# Patient Record
Sex: Female | Born: 1950 | Race: White | Hispanic: No | Marital: Married | Smoking: Former smoker
Health system: Southern US, Community
[De-identification: ages and names within clinical notes are randomized; demographics above are authoritative.]

## PROBLEM LIST (undated history)

## (undated) DIAGNOSIS — S42309A Unspecified fracture of shaft of humerus, unspecified arm, initial encounter for closed fracture: Secondary | ICD-10-CM

## (undated) DIAGNOSIS — F419 Anxiety disorder, unspecified: Secondary | ICD-10-CM

## (undated) DIAGNOSIS — I82402 Acute embolism and thrombosis of unspecified deep veins of left lower extremity: Secondary | ICD-10-CM

## (undated) DIAGNOSIS — M1990 Unspecified osteoarthritis, unspecified site: Secondary | ICD-10-CM

## (undated) HISTORY — DX: Unspecified fracture of shaft of humerus, unspecified arm, initial encounter for closed fracture: S42.309A

## (undated) HISTORY — PX: OTHER SURGICAL HISTORY: SHX169

## (undated) HISTORY — DX: Acute embolism and thrombosis of unspecified deep veins of left lower extremity: I82.402

## (undated) HISTORY — PX: BUNIONECTOMY: SHX129

## (undated) HISTORY — DX: Anxiety disorder, unspecified: F41.9

## (undated) HISTORY — PX: RADIAL HEAD EXCISION: SUR548

## (undated) HISTORY — PX: ANTERIOR CRUCIATE LIGAMENT REPAIR: SHX115

---

## 2010-09-15 ENCOUNTER — Emergency Department (HOSPITAL_COMMUNITY)
Admission: EM | Admit: 2010-09-15 | Discharge: 2010-09-15 | Payer: Self-pay | Source: Home / Self Care | Admitting: Emergency Medicine

## 2010-09-19 LAB — POCT I-STAT, CHEM 8
Creatinine, Ser: 1.1 mg/dL (ref 0.4–1.2)
Hemoglobin: 13.6 g/dL (ref 12.0–15.0)
Potassium: 3.8 mEq/L (ref 3.5–5.1)
Sodium: 141 mEq/L (ref 135–145)
TCO2: 29 mmol/L (ref 0–100)

## 2010-09-19 LAB — URINALYSIS, ROUTINE W REFLEX MICROSCOPIC
Hgb urine dipstick: NEGATIVE
Nitrite: NEGATIVE
Protein, ur: NEGATIVE mg/dL
Specific Gravity, Urine: 1.008 (ref 1.005–1.030)
Urobilinogen, UA: 0.2 mg/dL (ref 0.0–1.0)

## 2010-09-19 LAB — POCT CARDIAC MARKERS: Myoglobin, poc: 55.6 ng/mL (ref 12–200)

## 2010-09-23 ENCOUNTER — Ambulatory Visit
Admission: RE | Admit: 2010-09-23 | Discharge: 2010-09-23 | Payer: Self-pay | Source: Home / Self Care | Attending: Vascular Surgery | Admitting: Vascular Surgery

## 2010-09-23 NOTE — Assessment & Plan Note (Addendum)
OFFICE VISIT  Jennifer Whitehead, Jennifer Whitehead DOB:  01/28/1951                                       09/23/2010 UXNAT#:[id number]  This is a new patient consultation.  This patient is referred from the ER.  REASON FOR REFERRAL:  Possible fibromuscular dysplasia in bilateral internal carotid arteries.  HISTORY OF PRESENT ILLNESS:  This is a 60 year old female who presents with dizziness recently.  This patient recently went to the ER on 09/15/2010.  She was having dizziness in the setting of also some upper respiratory symptomatology.  They obtained a CTA of head and neck.  On the head it demonstrated no stroke.  There were no aneurysms in the intracranial segment.  The extracranial segment had no hemodynamically significant stenosis.  However, there were some irregularities in bilateral internal carotid arteries possibly consistent with fibromuscular dysplasia.  This patient has had no TIA or stroke symptomatology.  Has never had amaurosis fugax, monocular blindness. She has never had any facial droop or hemiplegia.  She has also never had any expressive or receptive aphasia.  She does not have any atherosclerotic risk factors.  PAST MEDICAL HISTORY:  History of left arm fracture, left knee injury and bilateral bunions.  Migraines.  PAST SURGICAL HISTORY:  Included bilateral bunionectomy, left ACL reconstruction and some type of surgery on her left arm for the fracture.  SOCIAL HISTORY:  No tobacco or illicit drug use; social alcohol drinker.  FAMILY HISTORY:  Mother is healthy.  Father died of some type of unknown blood disease related to occupational exposure.  She also has had a cousin with a brain aneurysm.  MEDICATIONS:  She is on multivitamin and Antivert.  ALLERGIES:  Her allergies included sulfa.  REVIEW OF SYSTEMS:  She noted dizziness, headache, blood clots 12 years ago in the right leg, anemia, otherwise is negative.  PHYSICAL EXAMINATION:  Vital  signs:  Her blood pressure in the right arm was 132/82, the left was 137/79, heart rate was 79, respirations were 12, satting 100% on room air. General:  Well-developed, well-nourished, in no apparent distress. Head:  Normocephalic, atraumatic. ENT:  Hearing grossly intact.  Nares without any erythema or drainage. Oropharynx without any erythema or exudate. Neck:  Supple without nuchal rigidity.  No palpable lymphadenopathy. Eyes:  Pupils were equal, round, reactive to light.  Extraocular movements were intact.  I did not detect any nystagmus despite testing for such. Pulmonary:  Symmetric expansion, good air movement.  No rales, rhonchi or wheezing. Cardiac:  Regular rate and rhythm.  Normal S1-S2, no murmurs, rubs, thrills or gallops. Vascular:  She had palpable pulses throughout.  Carotids had no bruits. Her abdominal aorta was palpable with normal size. Abdomen:  Soft, nontender, nondistended.  No guarding, no rebound, no hepatosplenomegaly.  No obvious masses. Musculoskeletal:  She had 5/5 strength in all extremities.  There were no signs of any ischemic changes in any extremity. Neurologic: Cranial nerves 2-12 were intact.  Motor was as above. Sensation was grossly intact in all extremities. Psychiatric:  Judgment was intact.  Mood and affect were appropriate for her clinical situation. Skin:  There were no ulcers or rashes noted. Lymphatics:  No cervical, axillary or inguinal lymphadenopathy.  I reviewed about 5 pages of outside documentation and looked at both her CTA of her head and neck and I am not impressed with the irregularity internal  carotid arteries and I can see why they have some concern of possible fibromuscular dysplasia with the pattern.  However, on my images I do not really appreciate that this is likely fibromuscular dysplasia.  MEDICAL DECISION MAKING:  This is a 60 year old female with a history of dizziness which is more consistent with either  labyrinthitis or benign positional vertigo.  To rule out fibromuscular dysplasia I think it would be simple to do carotid duplexes as none were done in the ER to evaluate the internal carotid segment which should be easily dopplerable and able to detect the pattern consistent of fibromuscular dysplasia. Regardless, at this point she does not have hemodynamically significant stenosis that would require any type of intervention.  Will obtain this over the next 2 to 4 weeks and she will follow up after that.    Fransisco Hertz, MD Electronically Signed  BLC/MEDQ  D:  09/23/2010  T:  09/23/2010  Job:  [id number]

## 2010-10-14 ENCOUNTER — Ambulatory Visit (INDEPENDENT_AMBULATORY_CARE_PROVIDER_SITE_OTHER): Payer: 59 | Admitting: Vascular Surgery

## 2010-10-14 ENCOUNTER — Other Ambulatory Visit (INDEPENDENT_AMBULATORY_CARE_PROVIDER_SITE_OTHER): Payer: 59

## 2010-10-14 DIAGNOSIS — R42 Dizziness and giddiness: Secondary | ICD-10-CM

## 2010-10-14 DIAGNOSIS — I6529 Occlusion and stenosis of unspecified carotid artery: Secondary | ICD-10-CM

## 2010-10-17 NOTE — Assessment & Plan Note (Signed)
OFFICE VISIT  Jennifer, Whitehead DOB:  05-Mar-1951                                       10/14/2010 [id number]#:[id number]  This is an established patient.  HISTORY OF PRESENT ILLNESS:  This is a patient that I saw on 09/23/2010. She was seen in the ER and there were some concerns on CTA of a possible fibromuscular dysplasia in her carotid arteries.  At that point entire workup in the ER was for dizziness.  Currently her symptomatology has completely resolved.  She has not had any TIA or stroke symptoms.  PHYSICAL EXAMINATION:  Today she had a blood pressure 124/76 in the right arm and 124/73 in the left arm, heart rate of 80, respirations were 12, satting 99%.  Cranial nerves II-XII were intact.  Motor strength was 5/5 in all extremities.  Sensation grossly intact in all extremities.  Her bilateral necks do not have carotids though pulses can be auscultated in both sides.  She had bilateral carotid duplexes completed which I interpreted.  She has absolutely no evidence of hemodynamically significant stenosis in either internal carotid artery and no evidence of changes consistent with fibromuscular dysplasia.  MEDICAL DECISION MAKING:  This is a 60 year old female that presented previously with dizziness which has resolved at this point.  I ordered bilateral carotid duplexes to evaluate for possible fibromuscular dysplasia.  At this point the workup is completely negative on duplex. At this point we discussed briefly the medical management of any carotid disease and in general atherosclerotic disease.  She has relatively few risk factors.  She is going to continue to maintain her weight, keep her cholesterol down and possibly add a baby aspirin to her regimen.  At this point no intervention is necessary and no surveillance in my opinion is necessary at this point.  Thank you for giving [place] the opportunity to participate in this patient's care.    Fransisco Hertz, MD Electronically Signed  BLC/MEDQ  D:  10/14/2010  T:  10/14/2010  Job:  [id number]  cc:   Samuel Jester, DO

## 2010-11-14 NOTE — Procedures (Unsigned)
CAROTID DUPLEX EXAM  INDICATION:  Dizziness.  HISTORY: Diabetes:  No. Cardiac:  No. Hypertension:  No. Smoking:  No. Previous Surgery:  No. CV History:  The patient is currently asymptomatic. Amaurosis Fugax No, Paresthesias No, Hemiparesis No                                      RIGHT             LEFT Brachial systolic pressure:         100               90 Brachial Doppler waveforms:         WNL               WNL Vertebral direction of flow:        Antegrade         Antegrade DUPLEX VELOCITIES (cm/sec) CCA peak systolic                   86                87 ECA peak systolic                   55                51 ICA peak systolic                   86                79 ICA end diastolic                   47                37 PLAQUE MORPHOLOGY:                  Mixed soft plaque PLAQUE AMOUNT:                      Mild PLAQUE LOCATION:                    ICA and ECA  IMPRESSION:  Bilaterally no hemodynamically significant stenosis. Bilateral intimal thickening within the common carotid arteries.  ___________________________________________ Fransisco Hertz, MD  OD/MEDQ  D:  10/14/2010  T:  10/14/2010  Job:  [id number]

## 2016-05-09 DIAGNOSIS — Z9189 Other specified personal risk factors, not elsewhere classified: Secondary | ICD-10-CM | POA: Insufficient documentation

## 2017-06-28 DIAGNOSIS — Z01419 Encounter for gynecological examination (general) (routine) without abnormal findings: Secondary | ICD-10-CM | POA: Diagnosis not present

## 2017-06-28 DIAGNOSIS — Z131 Encounter for screening for diabetes mellitus: Secondary | ICD-10-CM | POA: Diagnosis not present

## 2017-06-28 DIAGNOSIS — Z13220 Encounter for screening for lipoid disorders: Secondary | ICD-10-CM | POA: Diagnosis not present

## 2017-06-28 DIAGNOSIS — Z1321 Encounter for screening for nutritional disorder: Secondary | ICD-10-CM | POA: Diagnosis not present

## 2017-11-05 DIAGNOSIS — R197 Diarrhea, unspecified: Secondary | ICD-10-CM | POA: Diagnosis not present

## 2017-11-08 ENCOUNTER — Encounter (INDEPENDENT_AMBULATORY_CARE_PROVIDER_SITE_OTHER): Payer: Self-pay | Admitting: Orthopedic Surgery

## 2017-11-08 ENCOUNTER — Ambulatory Visit (INDEPENDENT_AMBULATORY_CARE_PROVIDER_SITE_OTHER): Payer: BLUE CROSS/BLUE SHIELD

## 2017-11-08 ENCOUNTER — Ambulatory Visit (INDEPENDENT_AMBULATORY_CARE_PROVIDER_SITE_OTHER): Payer: BLUE CROSS/BLUE SHIELD | Admitting: Orthopedic Surgery

## 2017-11-08 DIAGNOSIS — G8929 Other chronic pain: Secondary | ICD-10-CM | POA: Diagnosis not present

## 2017-11-08 DIAGNOSIS — M25562 Pain in left knee: Secondary | ICD-10-CM | POA: Diagnosis not present

## 2017-11-08 DIAGNOSIS — M25561 Pain in right knee: Secondary | ICD-10-CM

## 2017-11-08 NOTE — Progress Notes (Signed)
Office Visit Note   Patient: Jennifer Whitehead           Date of Birth: October 09, 1950           MRN: [id number] Visit Date: 11/08/2017 Requested by: Samuel Jester, DO [address] [place], [place] [address] PCP: Samuel Jester, DO  Subjective: Chief Complaint  Patient presents with  . Right Knee - Pain  . Left Knee - Pain    HPI: Heide is a patient with bilateral knee pain left worse than right.  She had previous ACL reconstruction done 12 years ago.  Reports a little bit of clicking on the left knee anterior lateral.  Describes medial sided burning in both knees.  She does like to work out a lot.  She does do agility training with her dog.  She is working out and doing squats but no lunges.  She works out about 3 times a week.  She is tried Biofreeze for the knee but it has not helped.  She is having some difficulty with stairs and inclines.              ROS: All systems reviewed are negative as they relate to the chief complaint within the history of present illness.  Patient denies  fevers or chills.   Assessment & Plan: Visit Diagnoses:  1. Chronic pain of both knees   2. Chronic pain of left knee     Plan: Impression is bilateral knee pain with mild medial compartment arthritis bilaterally.  Left knee has possibility of lateral meniscal pathology based on her history.  No real effusion in the graft has about 2 mm more laxity on the left compared to the right but with good endpoint.  Negative pivot shift.  Collaterals are stable on the left.  Plan is MRI scan left knee to evaluate for lateral meniscal tear.  I am also going to get her a hinged knee brace and have her try pen said.  I will see her back after that MRI scan and we will decide if we give her a prescription for the pen said or not if it is helping.  Follow-Up Instructions: Return for after MRI.   Orders:  Orders Placed This Encounter  Procedures  . XR KNEE 3 VIEW RIGHT  . XR KNEE 3 VIEW LEFT  . MR Knee  Left w/o contrast   No orders of the defined types were placed in this encounter.     Procedures: No procedures performed   Clinical Data: No additional findings.  Objective: Vital Signs: There were no vitals taken for this visit.  Physical Exam:   Constitutional: Patient appears well-developed HEENT:  Head: Normocephalic Eyes:EOM are normal Neck: Normal range of motion Cardiovascular: Normal rate Pulmonary/chest: Effort normal Neurologic: Patient is alert Skin: Skin is warm Psychiatric: Patient has normal mood and affect    Ortho Exam: Orthopedic exam demonstrates full active and passive range of motion of both knees.  There is no effusion in either knee.  Mild medial joint line tenderness bilaterally.  Pedal pulses palpable.  No masses lymph adenopathy or skin changes noted in the left or right knee region.  Extensor mechanism is intact and nontender.  On that left knee she has about 2 mm more anterior drawer on the left compared to the right but with solid endpoint.  Negative pivot shift on the left-hand side.  Does have lateral joint line tenderness on the left but not the right-hand side.  Specialty Comments:  No specialty comments available.  Imaging: Xr Knee 3 View Left  Result Date: 11/08/2017 AP lateral merchant left knee reviewed.  ACL screw in position in the tibia.  Mild medial spurring is present.  Joint spaces are maintained.  Mild patellofemoral arthritis is noted.  There is no fracture dislocation or effusion.  Xr Knee 3 View Right  Result Date: 11/08/2017 AP lateral merchant right knee reviewed.  Medial spurring is present but there is maintenance of the medial and lateral joint space.  There is mild to moderate patellofemoral arthritis present.  Slight varus alignment present.    PMFS History: There are no active problems to display for this patient.  History reviewed. No pertinent past medical history.  History reviewed. No pertinent family  history.  History reviewed. No pertinent surgical history. Social History   Occupational History  . Not on file  Tobacco Use  . Smoking status: Not on file  Substance and Sexual Activity  . Alcohol use: Not on file  . Drug use: Not on file  . Sexual activity: Not on file

## 2017-11-13 DIAGNOSIS — H00021 Hordeolum internum right upper eyelid: Secondary | ICD-10-CM | POA: Diagnosis not present

## 2017-11-14 ENCOUNTER — Telehealth (INDEPENDENT_AMBULATORY_CARE_PROVIDER_SITE_OTHER): Payer: Self-pay | Admitting: Orthopedic Surgery

## 2017-11-14 DIAGNOSIS — H00021 Hordeolum internum right upper eyelid: Secondary | ICD-10-CM | POA: Diagnosis not present

## 2017-11-14 MED ORDER — DICLOFENAC SODIUM 2 % TD SOLN: 2.0000 | Freq: Two times a day (BID) | TRANSDERMAL | 1 refills | Status: DC | PRN

## 2017-11-14 NOTE — Addendum Note (Signed)
Addended byPrescott Parma: Beverly Suriano on: 11/14/2017 03:15 PM   Modules accepted: Orders

## 2017-11-14 NOTE — Telephone Encounter (Signed)
Patient called stating that at her appointment last week she was given a sample of pennsaid and she really liked how it helped with the pain and would like for a RX to be called in to her pharmacy at [facility] on [address]. CB # [phone]

## 2017-11-14 NOTE — Telephone Encounter (Signed)
I submitted. Patient aware that insurance may not cover.

## 2017-11-19 MED ORDER — DICLOFENAC SODIUM 2 % TD SOLN: 2.0000 | Freq: Two times a day (BID) | TRANSDERMAL | 1 refills | Status: DC | PRN

## 2017-11-19 NOTE — Addendum Note (Signed)
Addended byPrescott Parma: Jennifer Whitehead on: 11/19/2017 01:41 PM   Modules accepted: Orders

## 2017-11-19 NOTE — Telephone Encounter (Signed)
I submitted to [facility] in [place]. IC patient and advised.

## 2017-11-19 NOTE — Telephone Encounter (Signed)
Patient called stating that without insurance the pennsaid was going to be over $3,000. She said you had mentioned going through a 3rd party to get the medication and she would like to discuss this with you. CB # [phone]

## 2017-11-24 ENCOUNTER — Ambulatory Visit
Admission: RE | Admit: 2017-11-24 | Discharge: 2017-11-24 | Disposition: A | Discharge status: Home / Self Care | Payer: BLUE CROSS/BLUE SHIELD | Source: Ambulatory Visit | Attending: Orthopedic Surgery | Admitting: Orthopedic Surgery

## 2017-11-24 DIAGNOSIS — M25562 Pain in left knee: Secondary | ICD-10-CM | POA: Diagnosis not present

## 2017-11-24 DIAGNOSIS — G8929 Other chronic pain: Secondary | ICD-10-CM

## 2017-12-06 ENCOUNTER — Encounter (INDEPENDENT_AMBULATORY_CARE_PROVIDER_SITE_OTHER): Payer: Self-pay | Admitting: Orthopedic Surgery

## 2017-12-06 ENCOUNTER — Telehealth (INDEPENDENT_AMBULATORY_CARE_PROVIDER_SITE_OTHER): Payer: Self-pay | Admitting: Orthopedic Surgery

## 2017-12-06 ENCOUNTER — Ambulatory Visit (INDEPENDENT_AMBULATORY_CARE_PROVIDER_SITE_OTHER): Payer: BLUE CROSS/BLUE SHIELD | Admitting: Orthopedic Surgery

## 2017-12-06 DIAGNOSIS — M1712 Unilateral primary osteoarthritis, left knee: Secondary | ICD-10-CM

## 2017-12-06 MED ORDER — HYDROCODONE-ACETAMINOPHEN 5-325 MG PO TABS: ORAL_TABLET | ORAL | 0 refills | Status: DC

## 2017-12-06 NOTE — Telephone Encounter (Signed)
Jennifer Whitehead with [facility] called advised she is faxing a form over concerning the Rx for Pensaid. The number to contact Toney ReilDaisy is [phone]

## 2017-12-07 NOTE — Telephone Encounter (Signed)
Received, completed and faxed back.

## 2017-12-09 ENCOUNTER — Encounter (INDEPENDENT_AMBULATORY_CARE_PROVIDER_SITE_OTHER): Payer: Self-pay | Admitting: Orthopedic Surgery

## 2017-12-09 NOTE — Progress Notes (Signed)
   Office Visit Note   Patient: Jennifer Whitehead           Date of Birth: 1951-06-27           MRN: [id number] Visit Date: 12/06/2017 Requested by: No referring provider defined for this encounter. PCP: System, Pcp Not In  Subjective: Chief Complaint  Patient presents with  . Left Knee - Follow-up    HPI: Jennifer Whitehead is a patient with left knee pain.  Since I have seen her she has had a left knee MRI scan.  This scan shows tricompartmental osteoarthritis along with severely attenuated torn ACL graft.  Is been adjusting her workouts so that she does not do lunges or stairmaster.  She states that the pain comes and goes.  She is been using a brace when she is running with her dogs.  She does take occasional hydrocodone for her symptoms.  Does not really report much in the way of symptomatic instability but does have pain.              ROS: All systems reviewed are negative as they relate to the chief complaint within the history of present illness.  Patient denies  fevers or chills.   Assessment & Plan: Visit Diagnoses:  1. Unilateral primary osteoarthritis, left knee     Plan: Impression is tricompartmental arthritis with absent posterior horn of the medial meniscus and dysfunctional ACL.  Plan is continue strengthening exercises.  She is going to need some type of procedure in the future but currently she is asymptomatic enough that that is not really at play.  I will see her back as needed.  Follow-Up Instructions: Return if symptoms worsen or fail to improve.   Orders:  No orders of the defined types were placed in this encounter.  Meds ordered this encounter  Medications  . HYDROcodone-acetaminophen (NORCO/VICODIN) 5-325 MG tablet    Sig: 1 po q d prn pain    Dispense:  30 tablet    Refill:  0      Procedures: No procedures performed   Clinical Data: No additional findings.  Objective: Vital Signs: There were no vitals taken for this visit.  Physical Exam:    Constitutional: Patient appears well-developed HEENT:  Head: Normocephalic Eyes:EOM are normal Neck: Normal range of motion Cardiovascular: Normal rate Pulmonary/chest: Effort normal Neurologic: Patient is alert Skin: Skin is warm Psychiatric: Patient has normal mood and affect    Ortho Exam: Orthopedic exam demonstrates 5 degree flexion contracture on the left with good flexion.  Collaterals are stable.  No PCL instability.  Not much in the way of anterior instability.  Collaterals are stable.  No effusion.  Range of motion is good.  No groin pain with internal/external rotation of the leg.Marland Kitchen.  Specialty Comments:  No specialty comments available.  Imaging: No results found.   PMFS History: There are no active problems to display for this patient.  History reviewed. No pertinent past medical history.  History reviewed. No pertinent family history.  History reviewed. No pertinent surgical history. Social History   Occupational History  . Not on file  Tobacco Use  . Smoking status: Never Smoker  . Smokeless tobacco: Never Used  Substance and Sexual Activity  . Alcohol use: Not on file  . Drug use: Not on file  . Sexual activity: Not on file

## 2017-12-12 DIAGNOSIS — R197 Diarrhea, unspecified: Secondary | ICD-10-CM | POA: Diagnosis not present

## 2018-03-26 ENCOUNTER — Telehealth (INDEPENDENT_AMBULATORY_CARE_PROVIDER_SITE_OTHER): Payer: Self-pay | Admitting: Orthopedic Surgery

## 2018-03-26 NOTE — Telephone Encounter (Signed)
IC explained, no appt available. Scheduled her to see Lillia AbedLindsay on Friday.

## 2018-03-26 NOTE — Telephone Encounter (Signed)
Patient called asked if she can be worked into Dr [facility] HoldingsDe[facility] schedule because it hurts her to drive. Patient said her right knee has gotten worse and she need a cortisone injection. The number to contact patient is [phone]

## 2018-03-29 ENCOUNTER — Encounter (INDEPENDENT_AMBULATORY_CARE_PROVIDER_SITE_OTHER): Payer: Self-pay | Admitting: Physician Assistant

## 2018-03-29 ENCOUNTER — Ambulatory Visit (INDEPENDENT_AMBULATORY_CARE_PROVIDER_SITE_OTHER): Payer: BLUE CROSS/BLUE SHIELD | Admitting: Physician Assistant

## 2018-03-29 DIAGNOSIS — G8929 Other chronic pain: Secondary | ICD-10-CM | POA: Diagnosis not present

## 2018-03-29 DIAGNOSIS — M25561 Pain in right knee: Secondary | ICD-10-CM | POA: Diagnosis not present

## 2018-03-29 MED ORDER — METHYLPREDNISOLONE ACETATE 40 MG/ML IJ SUSP
40.0000 mg | INTRAMUSCULAR | Status: AC | PRN
  Administered 2018-03-29: 40 mg via INTRA_ARTICULAR

## 2018-03-29 MED ORDER — BUPIVACAINE HCL 0.25 % IJ SOLN
2.0000 mL | INTRAMUSCULAR | Status: AC | PRN
  Administered 2018-03-29: 2 mL via INTRA_ARTICULAR

## 2018-03-29 MED ORDER — LIDOCAINE HCL 1 % IJ SOLN
2.0000 mL | INTRAMUSCULAR | Status: AC | PRN
  Administered 2018-03-29: 2 mL

## 2018-03-29 NOTE — Progress Notes (Signed)
   Procedure Note  Patient: Jennifer Whitehead             Date of Birth: 1951/08/14           MRN: [id number]             Visit Date: 03/29/2018  Procedures: Visit Diagnoses: Chronic pain of right knee  Large Joint Inj: R knee on 03/29/2018 1:20 PM Indications: pain Details: 22 G needle, anterolateral approach Medications: 2 mL lidocaine 1 %; 2 mL bupivacaine 0.25 %; 40 mg methylPREDNISolone acetate 40 MG/ML

## 2018-03-29 NOTE — Progress Notes (Signed)
     Patient: Jennifer BubaMildred Whitehead           Date of Birth: Dec 10, 1950           MRN: [id number] Visit Date: 03/29/2018 PCP: System, Pcp Not In   Assessment & Plan:  Chief Complaint:  Chief Complaint  Patient presents with  . Right Knee - Pain   Visit Diagnoses:  1. Chronic pain of right knee     Plan: Patient is a pleasant 67 year old female who presents to our clinic today with right knee pain.  History of tract departmental osteoarthritis on the left knee and she has been favoring the right knee where she also has known arthritis.  No previous cortisone injection, but she is requesting one today.  Today, we proceeded with a right knee intra-articular cortisone injection.  She is encouraged to rest, ice and elevate as much as possible over the next couple days.  She will follow-up with Dr. August Saucerean as needed.  Call with concerns or questions in the meantime.  Follow-Up Instructions: Return if symptoms worsen or fail to improve.   Orders:  No orders of the defined types were placed in this encounter.  No orders of the defined types were placed in this encounter.   Imaging: No results found.  PMFS History: There are no active problems to display for this patient.  History reviewed. No pertinent past medical history.  History reviewed. No pertinent family history.  History reviewed. No pertinent surgical history. Social History   Occupational History  . Not on file  Tobacco Use  . Smoking status: Never Smoker  . Smokeless tobacco: Never Used  Substance and Sexual Activity  . Alcohol use: Not on file  . Drug use: Not on file  . Sexual activity: Not on file

## 2018-04-22 ENCOUNTER — Ambulatory Visit (INDEPENDENT_AMBULATORY_CARE_PROVIDER_SITE_OTHER): Payer: BLUE CROSS/BLUE SHIELD | Admitting: Orthopedic Surgery

## 2018-04-22 ENCOUNTER — Encounter (INDEPENDENT_AMBULATORY_CARE_PROVIDER_SITE_OTHER): Payer: Self-pay | Admitting: Orthopedic Surgery

## 2018-04-22 ENCOUNTER — Ambulatory Visit (INDEPENDENT_AMBULATORY_CARE_PROVIDER_SITE_OTHER): Payer: Self-pay

## 2018-04-22 DIAGNOSIS — M25572 Pain in left ankle and joints of left foot: Secondary | ICD-10-CM

## 2018-04-23 NOTE — Progress Notes (Deleted)
Office Visit Note   Patient: Jennifer Whitehead           Date of Birth: [birth year]           MRN: [id number] Visit Date: 04/22/2018 Requested by: No referring provider defined for this encounter. PCP: System, Pcp Not In  Subjective: Chief Complaint  Patient presents with  . Left Foot - Pain, Injury  . Left Knee - Follow-up    HPI: Jennifer Whitehead is a patient with right shoulder pain.  Patient had an injection 03/26/2018.  Reports chronic right shoulder pain.  She had arthroscopy with distal clavicle excision in August 2018.  She is here to discuss surgical options.  She has rheumatoid arthritis and osteoarthritis.  She takes Plaquenil and methotrexate.  She states that the shoulder catches.  She has pain which radiates down the arm to the fingers.  She states she has some "problems in her neck".  She cannot really do much of anything with that right shoulder.  She has a trip to the beach occurring in November.  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              ROS: All systems reviewed are negative as they relate to the chief complaint within the history of present illness.  Patient denies  fevers or chills.   Assessment & Plan: Visit Diagnoses:  1. Pain in left ankle and joints of left foot     Plan: Impression is right shoulder rotator cuff arthropathy with diminished shoulder function the patient who has rheumatoid arthritis.  She is at increased surgical risk for infections as well as implant loosening due to bone quality.  Discussed with her the risk and benefits of reverse shoulder replacement.  They include but not limited to infection nerve vessel damage dislocation implant loosening.  I will see her back in mid October so we can discuss options further.  I would want to get CT scan for patient specific instrumentation and planning.  Follow-Up Instructions: No follow-ups on file.   Orders:  Orders Placed This Encounter    Procedures  . XR Foot Complete Left   No orders of the defined types were placed in this encounter.     Procedures: No procedures performed   Clinical Data: No additional findings.  Objective: Vital Signs: There were no vitals taken for this visit.  Physical Exam:   Constitutional: Patient appears well-developed HEENT:  Head: Normocephalic Eyes:EOM are normal Neck: Normal range of motion Cardiovascular: Normal rate Pulmonary/chest: Effort normal Neurologic: Patient is alert Skin: Skin is warm Psychiatric: Patient has normal mood and affect    Ortho Exam: Ortho exam demonstrates pretty reasonable cervical spine range of motion with 5 out of 5 grip EPL FPL interosseous wrist flexion extension bicep triceps and deltoid strength.  Patient has less than 90 degrees of forward flexion and abduction on the right-hand side.  She has diminished infraspinatus and supraspinatus strength testing.  Subscap strength seems intact.  Passive range of motion is pretty easy to 170/90 degrees of abduction.  Specialty Comments:  No specialty comments available.  Imaging: No results found.   PMFS History: There are no active problems to display for this patient.  History reviewed. No pertinent past medical history.  History reviewed. No pertinent family history.  History reviewed. No pertinent surgical history. Social History   Occupational History  . Not on file  Tobacco Use  . Smoking status: Never Smoker  .  Smokeless tobacco: Never Used  Substance and Sexual Activity  . Alcohol use: Not on file  . Drug use: Not on file  . Sexual activity: Not on file

## 2018-04-23 NOTE — Progress Notes (Signed)
Office Visit Note   Patient: Jennifer Whitehead           Date of Birth: Jun 18, 1951           MRN: [id number] Visit Date: 04/22/2018 Requested by: No referring provider defined for this encounter. PCP: System, Pcp Not In  Subjective: Chief Complaint  Patient presents with  . Left Foot - Pain, Injury  . Left Knee - Follow-up    HPI: Jennifer CroftMildred is a patient with left foot pain.  She rolled her foot a few days ago and reports pain on the lateral aspect around the base the fifth metatarsal.  She is also here for follow-up of her left knee.  She reports continued pain weakness and giving way.  Though giving way is the more symptomatic issue.  The right knee had cortisone 3 weeks ago but that is the knee that is giving her pain.  She likes to work out about 3 times a week.              ROS: All systems reviewed are negative as they relate to the chief complaint within the history of present illness.  Patient denies  fevers or chills.   Assessment & Plan: Visit Diagnoses:  1. Pain in left ankle and joints of left foot     Plan: Impression is left knee arthritis with symptomatic giving way and diminutive ACL graft from years ago.  The right knee is the more painful knee which also has arthritis present.  She is going to consider her options for total knee replacement on the right-hand side and call me when she wants to schedule.  We discussed risk and benefits of that procedure today including but not limited to infection nerve vessel damage knee stiffness and potential need for revision.  Patient understands the risk benefits and wishes to proceed at some time in the future.  Follow-Up Instructions: No follow-ups on file.   Orders:  Orders Placed This Encounter  Procedures  . XR Foot Complete Left   No orders of the defined types were placed in this encounter.     Procedures: No procedures performed   Clinical Data: No additional findings.  Objective: Vital Signs: There were no  vitals taken for this visit.  Physical Exam:   Constitutional: Patient appears well-developed HEENT:  Head: Normocephalic Eyes:EOM are normal Neck: Normal range of motion Cardiovascular: Normal rate Pulmonary/chest: Effort normal Neurologic: Patient is alert Skin: Skin is warm Psychiatric: Patient has normal mood and affect    Ortho Exam: Ortho exam demonstrates on that left ankle palpable intact nontender anterior to posterior tib peroneal and Achilles tendons with symmetric pedal pulses.  She does have some tenderness at the base of the fifth metatarsal.  Radiographs negative in this area.  No other masses lymph adenopathy or skin changes noted in that left knee region.  Both knees are examined.  Some laxity is difficult to assess left knee versus right knee.  In general the left knee although is diminutive ACL graft feels relatively stable.  Slight flexion contracture is present.  On that right knee she has medial and lateral joint line tenderness intact extensor mechanism and palpable pedal pulses.  Specialty Comments:  No specialty comments available.  Imaging: No results found.   PMFS History: There are no active problems to display for this patient.  History reviewed. No pertinent past medical history.  History reviewed. No pertinent family history.  History reviewed. No pertinent surgical history. Social History  Occupational History  . Not on file  Tobacco Use  . Smoking status: Never Smoker  . Smokeless tobacco: Never Used  Substance and Sexual Activity  . Alcohol use: Not on file  . Drug use: Not on file  . Sexual activity: Not on file

## 2018-06-05 DIAGNOSIS — Z23 Encounter for immunization: Secondary | ICD-10-CM | POA: Diagnosis not present

## 2018-06-20 ENCOUNTER — Telehealth (INDEPENDENT_AMBULATORY_CARE_PROVIDER_SITE_OTHER): Payer: Self-pay | Admitting: Orthopedic Surgery

## 2018-06-20 NOTE — Telephone Encounter (Signed)
Please review and complete blue sheet so I can give to [facility]

## 2018-06-20 NOTE — Telephone Encounter (Signed)
Patient called stated wanted to move forward with knee replacement.  Please call patient to advise. [phone]

## 2018-06-21 NOTE — Telephone Encounter (Signed)
Done pls calal thx

## 2018-06-21 NOTE — Telephone Encounter (Signed)
Given to Debbie

## 2018-07-02 DIAGNOSIS — Z1239 Encounter for other screening for malignant neoplasm of breast: Secondary | ICD-10-CM | POA: Diagnosis not present

## 2018-07-02 DIAGNOSIS — Z131 Encounter for screening for diabetes mellitus: Secondary | ICD-10-CM | POA: Diagnosis not present

## 2018-07-02 DIAGNOSIS — Z1231 Encounter for screening mammogram for malignant neoplasm of breast: Secondary | ICD-10-CM | POA: Diagnosis not present

## 2018-07-02 DIAGNOSIS — Z01419 Encounter for gynecological examination (general) (routine) without abnormal findings: Secondary | ICD-10-CM | POA: Diagnosis not present

## 2018-07-02 DIAGNOSIS — Z1321 Encounter for screening for nutritional disorder: Secondary | ICD-10-CM | POA: Diagnosis not present

## 2018-07-02 DIAGNOSIS — Z13220 Encounter for screening for lipoid disorders: Secondary | ICD-10-CM | POA: Diagnosis not present

## 2018-07-15 ENCOUNTER — Ambulatory Visit (INDEPENDENT_AMBULATORY_CARE_PROVIDER_SITE_OTHER): Payer: BLUE CROSS/BLUE SHIELD | Admitting: Orthopedic Surgery

## 2018-07-15 ENCOUNTER — Encounter (INDEPENDENT_AMBULATORY_CARE_PROVIDER_SITE_OTHER): Payer: Self-pay | Admitting: Orthopedic Surgery

## 2018-07-15 DIAGNOSIS — M1712 Unilateral primary osteoarthritis, left knee: Secondary | ICD-10-CM

## 2018-07-15 DIAGNOSIS — M1711 Unilateral primary osteoarthritis, right knee: Secondary | ICD-10-CM | POA: Diagnosis not present

## 2018-07-17 ENCOUNTER — Encounter (INDEPENDENT_AMBULATORY_CARE_PROVIDER_SITE_OTHER): Payer: Self-pay | Admitting: Orthopedic Surgery

## 2018-07-17 DIAGNOSIS — M1712 Unilateral primary osteoarthritis, left knee: Secondary | ICD-10-CM

## 2018-07-17 DIAGNOSIS — M1711 Unilateral primary osteoarthritis, right knee: Secondary | ICD-10-CM

## 2018-07-17 MED ORDER — METHYLPREDNISOLONE ACETATE 40 MG/ML IJ SUSP
40.0000 mg | INTRAMUSCULAR | Status: AC | PRN
  Administered 2018-07-17: 40 mg via INTRA_ARTICULAR

## 2018-07-17 MED ORDER — LIDOCAINE HCL 1 % IJ SOLN
5.0000 mL | INTRAMUSCULAR | Status: AC | PRN
  Administered 2018-07-17: 5 mL

## 2018-07-17 MED ORDER — BUPIVACAINE HCL 0.25 % IJ SOLN
4.0000 mL | INTRAMUSCULAR | Status: AC | PRN
  Administered 2018-07-17: 4 mL via INTRA_ARTICULAR

## 2018-07-17 NOTE — Progress Notes (Signed)
Office Visit Note   Patient: Jennifer Whitehead           Date of Birth: 21-Oct-1950           MRN: [id number] Visit Date: 07/15/2018 Requested by: No referring provider defined for this encounter. PCP: System, Pcp Not In  Subjective: Chief Complaint  Patient presents with  . Left Knee - Pain  . Right Knee - Pain    HPI: Jennifer CroftMildred is a patient with bilateral knee pain.  She reports previous injection in the right knee in August which helped.  She has known history of left knee tricompartmental arthritis as well.  She has ACL laxity in that left knee.  She states that the left knee will go out of place.  Over-the-counter medications have not been helpful but she does use Mobic.  She try to get off Mobic but was hurting a lot after 1 week.  This is affecting her quality of life.  Difficult for her to go down the stairs due to the right knee pain and left knee pain and instability.  She works with her dog in Product/process development scientistagility training.  She likes to work out about 3 times a week.  Right knee injection helped for 3 months but she is had no injection in the left knee.              ROS: All systems reviewed are negative as they relate to the chief complaint within the history of present illness.  Patient denies  fevers or chills.   Assessment & Plan: Visit Diagnoses:  1. Unilateral primary osteoarthritis, left knee   2. Unilateral primary osteoarthritis, right knee     Plan: Impression is good result with right knee injection in August.  Patient is having left knee symptoms.  Plan is to inject the right knee today.  We will also inject the left knee.  We will see how that works for her in terms of relieving some pain.  Consider surgical treatment next year.  42100-month return in February to discuss knee replacement.  Follow-Up Instructions: Return in about 4 months (around 11/13/2018).   Orders:  No orders of the defined types were placed in this encounter.  No orders of the defined types were placed in  this encounter.     Procedures: Large Joint Inj: bilateral knee on 07/17/2018 12:14 PM Indications: diagnostic evaluation, joint swelling and pain Details: 18 G 1.5 in needle, superolateral approach  Arthrogram: No  Medications (Right): 5 mL lidocaine 1 %; 4 mL bupivacaine 0.25 %; 40 mg methylPREDNISolone acetate 40 MG/ML Medications (Left): 5 mL lidocaine 1 %; 4 mL bupivacaine 0.25 %; 40 mg methylPREDNISolone acetate 40 MG/ML Outcome: tolerated well, no immediate complications Procedure, treatment alternatives, risks and benefits explained, specific risks discussed. Consent was given by the patient. Immediately prior to procedure a time out was called to verify the correct patient, procedure, equipment, support staff and site/side marked as required. Patient was prepped and draped in the usual sterile fashion.       Clinical Data: No additional findings.  Objective: Vital Signs: There were no vitals taken for this visit.  Physical Exam:   Constitutional: Patient appears well-developed HEENT:  Head: Normocephalic Eyes:EOM are normal Neck: Normal range of motion Cardiovascular: Normal rate Pulmonary/chest: Effort normal Neurologic: Patient is alert Skin: Skin is warm Psychiatric: Patient has normal mood and affect    Ortho Exam: Ortho exam demonstrates normal gait alignment with palpable pedal pulses.  Knee extensor mechanism  is intact.  Collaterals are stable bilaterally.  She does have some ACL laxity on the left but not on the right.  Range of motion is easily past 90 degrees in both knees.  Specialty Comments:  No specialty comments available.  Imaging: No results found.   PMFS History: There are no active problems to display for this patient.  History reviewed. No pertinent past medical history.  History reviewed. No pertinent family history.  History reviewed. No pertinent surgical history. Social History   Occupational History  . Not on file  Tobacco  Use  . Smoking status: Never Smoker  . Smokeless tobacco: Never Used  Substance and Sexual Activity  . Alcohol use: Not on file  . Drug use: Not on file  . Sexual activity: Not on file

## 2018-09-09 ENCOUNTER — Other Ambulatory Visit (INDEPENDENT_AMBULATORY_CARE_PROVIDER_SITE_OTHER): Payer: Self-pay | Admitting: Orthopedic Surgery

## 2018-09-09 DIAGNOSIS — M1712 Unilateral primary osteoarthritis, left knee: Secondary | ICD-10-CM

## 2018-09-13 NOTE — Pre-Procedure Instructions (Addendum)
Jennifer Whitehead  09/13/2018     [id number] [place] - [address] [place] [id number] Phone: [phone] Fax: [phone]  [facility] [place] - [address] [place] [id number] Phone: [phone] Fax: [phone]    Your procedure is scheduled on  Tuesday, September 24, 2017  [facility] at [facility]:30 [facility].M.  Call this number if you have problems the morning of surgery:  [phone]   Remember:  Do not eat or drink after midnight.   Take these medicines the morning of surgery with A SIP OF WATER   [name]   Follow your surgeon's instructions on when to stop Aspirin.  If no instructions were given by your surgeon then you will need to call the office to get those instructions.    7 days prior to surgery STOP taking any Aspirin (unless otherwise instructed by your surgeon), Aleve, Naproxen, Ibuprofen, Motrin, Advil, Goody's, BC's, all herbal medications, fish oil, and all vitamins, including meloxicam (MOBIC).   Do not wear jewelry, make-up or nail polish.  Do not wear lotions, powders, or perfumes, or deodorant.  Do not shave 48 hours prior to surgery.   Do not bring valuables to the hospital.  [facility] is not responsible for any belongings or valuables.  Contacts, dentures or bridgework may not be worn into surgery.  Leave your suitcase in the car.  After surgery it may be brought to your room.  For patients admitted to the hospital, discharge time will be determined by your treatment team.  Patients discharged the day of surgery will not be allowed to drive home.   Special instructions:   [facility]- Preparing For Surgery  Before surgery, you can play an important role. Because skin is not sterile, your skin needs to be as free of germs as possible. You can reduce the number of germs on your skin by washing with CHG (chlorahexidine gluconate)  Soap before surgery.  CHG is an antiseptic cleaner which kills germs and bonds with the skin to continue killing germs even after washing.    Oral Hygiene is also important to reduce your risk of infection.  Remember - BRUSH YOUR TEETH THE MORNING OF SURGERY WITH YOUR REGULAR TOOTHPASTE  Please do not use if you have an allergy to CHG or antibacterial soaps. If your skin becomes reddened/irritated stop using the CHG.  Do not shave (including legs and underarms) for at least 48 hours prior to first CHG shower. It is OK to shave your face.  Please follow these instructions carefully.   1. Shower the NIGHT BEFORE SURGERY and the MORNING OF SURGERY with CHG.   2. If you chose to wash your hair, wash your hair first as usual with your normal shampoo.  3. After you shampoo, rinse your hair and body thoroughly to remove the shampoo.  4. Use CHG as you would any other liquid soap. You can apply CHG directly to the skin and wash gently with a scrungie or a clean washcloth.   5. Apply the CHG Soap to your body ONLY FROM THE NECK DOWN.  Do not use on open wounds or open sores. Avoid contact with your eyes, ears, mouth and genitals (private parts). Wash Face and genitals (private parts)  with your normal soap.  6. Wash thoroughly, paying special attention to the area where your surgery will be performed.  7. Thoroughly rinse  your body with warm water from the neck down.  8. DO NOT shower/wash with your normal soap after using and rinsing off the CHG Soap.  9. Pat yourself dry with a CLEAN TOWEL.  10. Wear CLEAN PAJAMAS to bed the night before surgery, wear comfortable clothes the morning of surgery  11. Place CLEAN SHEETS on your bed the night of your first shower and DO NOT SLEEP WITH PETS.  Day of Surgery:  Do not apply any deodorants/lotions.  Please wear clean clothes to the hospital/surgery center.   Remember to brush your teeth WITH YOUR REGULAR TOOTHPASTE.  Please read over the  following fact sheets that you were given. Pain Booklet, Coughing and Deep Breathing, MRSA Information and Surgical Site Infection Prevention

## 2018-09-16 ENCOUNTER — Encounter (HOSPITAL_COMMUNITY): Payer: Self-pay | Admitting: *Deleted

## 2018-09-16 ENCOUNTER — Encounter (HOSPITAL_COMMUNITY)
Admission: RE | Admit: 2018-09-16 | Discharge: 2018-09-16 | Disposition: A | Discharge status: Home / Self Care | Payer: BLUE CROSS/BLUE SHIELD | Source: Ambulatory Visit | Attending: Orthopedic Surgery | Admitting: Orthopedic Surgery

## 2018-09-16 DIAGNOSIS — Z01812 Encounter for preprocedural laboratory examination: Secondary | ICD-10-CM | POA: Insufficient documentation

## 2018-09-16 HISTORY — DX: Unspecified osteoarthritis, unspecified site: M19.90

## 2018-09-16 LAB — CBC
HCT: 40.2 % (ref 36.0–46.0)
Hemoglobin: 12.9 g/dL (ref 12.0–15.0)
MCH: 30.4 pg (ref 26.0–34.0)
MCHC: 32.1 g/dL (ref 30.0–36.0)
MCV: 94.8 fL (ref 80.0–100.0)
Platelets: 305 10*3/uL (ref 150–400)
RBC: 4.24 MIL/uL (ref 3.87–5.11)
RDW: 13.8 % (ref 11.5–15.5)
WBC: 10 10*3/uL (ref 4.0–10.5)
nRBC: 0 % (ref 0.0–0.2)

## 2018-09-16 LAB — URINALYSIS, ROUTINE W REFLEX MICROSCOPIC
Bilirubin Urine: NEGATIVE
Glucose, UA: NEGATIVE mg/dL
Hgb urine dipstick: NEGATIVE
Ketones, ur: NEGATIVE mg/dL
Leukocytes, UA: NEGATIVE
Nitrite: NEGATIVE
Protein, ur: NEGATIVE mg/dL
Specific Gravity, Urine: 1.005 (ref 1.005–1.030)
pH: 5 (ref 5.0–8.0)

## 2018-09-16 LAB — BASIC METABOLIC PANEL WITH GFR
Anion gap: 10 (ref 5–15)
BUN: 20 mg/dL (ref 8–23)
CO2: 26 mmol/L (ref 22–32)
Calcium: 9.4 mg/dL (ref 8.9–10.3)
Chloride: 104 mmol/L (ref 98–111)
Creatinine, Ser: 0.71 mg/dL (ref 0.44–1.00)
GFR calc Af Amer: >60 mL/min
GFR calc non Af Amer: >60 mL/min
Glucose, Bld: 122 mg/dL — ABNORMAL HIGH (ref 70–99)
Potassium: 3.8 mmol/L (ref 3.5–5.1)
Sodium: 140 mmol/L (ref 135–145)

## 2018-09-16 LAB — SURGICAL PCR SCREEN
MRSA, PCR: NEGATIVE
Staphylococcus aureus: NEGATIVE

## 2018-09-16 NOTE — Pre-Procedure Instructions (Signed)
Sharnae Friedrich  09/16/2018     [id number] [place], [place] - [address] [place] [address] Phone: [phone] Fax: [phone]  [facility] [place] - [address] [place] [address] Phone: [phone] Fax: [phone]    Your procedure is scheduled on  Tuesday, September 24, 2017  Report to [facility] Admitting at 5:30 A.M.  Call this number if you have problems the morning of surgery:  [phone]   Remember:  Do not eat or drink after midnight.   Take these medicines the morning of surgery with A SIP OF WATER   [name]     7 days prior to surgery STOP taking any Aspirin (unless otherwise instructed by your surgeon), Aleve, Naproxen, Ibuprofen, Motrin, Advil, Goody's, BC's, all herbal medications, fish oil, and all vitamins, including meloxicam (MOBIC).   Do not wear jewelry, make-up or nail polish.  Do not wear lotions, powders, or perfumes, or deodorant.  Do not shave 48 hours prior to surgery.   Do not bring valuables to the hospital.  [facility] is not responsible for any belongings or valuables.  Contacts, dentures or bridgework may not be worn into surgery.  Leave your suitcase in the car.  After surgery it may be brought to your room.  For patients admitted to the hospital, discharge time will be determined by your treatment team.  Patients discharged the day of surgery will not be allowed to drive home.   Special instructions:   [facility]- Preparing For Surgery  Before surgery, you can play an important role. Because skin is not sterile, your skin needs to be as free of germs as possible. You can reduce the number of germs on your skin by washing with CHG (chlorahexidine gluconate) Soap before surgery.  CHG is an antiseptic cleaner which kills germs and bonds with the skin to continue killing germs even after washing.    Oral Hygiene is also  important to reduce your risk of infection.  Remember - BRUSH YOUR TEETH THE MORNING OF SURGERY WITH YOUR REGULAR TOOTHPASTE  Please do not use if you have an allergy to CHG or antibacterial soaps. If your skin becomes reddened/irritated stop using the CHG.  Do not shave (including legs and underarms) for at least 48 hours prior to first CHG shower. It is OK to shave your face.  Please follow these instructions carefully.   1. Shower the NIGHT BEFORE SURGERY and the MORNING OF SURGERY with CHG.   2. If you chose to wash your hair, wash your hair first as usual with your normal shampoo.  3. After you shampoo, rinse your hair and body thoroughly to remove the shampoo.  4. Use CHG as you would any other liquid soap. You can apply CHG directly to the skin and wash gently with a scrungie or a clean washcloth.   5. Apply the CHG Soap to your body ONLY FROM THE NECK DOWN.  Do not use on open wounds or open sores. Avoid contact with your eyes, ears, mouth and genitals (private parts). Wash Face and genitals (private parts)  with your normal soap.  6. Wash thoroughly, paying special attention to the area where your surgery will be performed.  7. Thoroughly rinse your body with warm water from the neck down.  8. DO NOT shower/wash with your normal soap after using and rinsing off the CHG Soap.  9. Pat yourself dry  with a CLEAN TOWEL.  10. Wear CLEAN PAJAMAS to bed the night before surgery, wear comfortable clothes the morning of surgery  11. Place CLEAN SHEETS on your bed the night of your first shower and DO NOT SLEEP WITH PETS.  Day of Surgery:  Do not apply any deodorants/lotions.  Please wear clean clothes to the hospital/surgery center.   Remember to brush your teeth WITH YOUR REGULAR TOOTHPASTE.  Please read over the following fact sheets that you were given. Pain Booklet, Coughing and Deep Breathing, MRSA Information and Surgical Site Infection Prevention

## 2018-09-16 NOTE — Progress Notes (Signed)
PCP -  No PCP,pt. See Dr. Malena Peer @ [facility] in [facility] Cardiologist - na  Chest x-ray - na EKG - na Stress Test - na ECHO - na Cardiac Cath - na  Sleep Study - na CPAP -   Fasting Blood Sugar - na Checks Blood Sugar _____ times a day  Blood Thinner Instructions: Aspirin Instructions:na  Anesthesia review:   Patient denies shortness of breath, fever, cough and chest pain at PAT appointment   Patient verbalized understanding of instructions that were given to them at the PAT appointment. Patient was also instructed that they will need to review over the PAT instructions again at home before surgery.

## 2018-09-17 LAB — URINE CULTURE: Culture: <10000 — AB

## 2018-09-20 ENCOUNTER — Telehealth (INDEPENDENT_AMBULATORY_CARE_PROVIDER_SITE_OTHER): Payer: Self-pay | Admitting: Orthopedic Surgery

## 2018-09-20 NOTE — Telephone Encounter (Signed)
Patient called and left message on voicemail wanting to discuss need for  handicap parking sticker and had question about using a handicap ramp. She's not sure if a ramp will  be necessary, but all entrances into the home have steps.  Please advise.    Pt's cb [phone]

## 2018-09-23 MED ORDER — CEFAZOLIN SODIUM-DEXTROSE 2-4 GM/100ML-% IV SOLN
2.0000 g | INTRAVENOUS | Status: AC
  Administered 2018-09-24: 2 g via INTRAVENOUS
  Filled 2018-09-23: qty 100

## 2018-09-23 NOTE — Telephone Encounter (Signed)
It is okay for handicap placard.  Handicap ramps are up to her and her family.

## 2018-09-23 NOTE — Telephone Encounter (Signed)
Ok for handicap placard

## 2018-09-23 NOTE — Anesthesia Preprocedure Evaluation (Signed)
Anesthesia Evaluation  Patient identified by MRN, date of birth, ID band Patient awake    Reviewed: Allergy & Precautions, H&P , NPO status , Patient's Chart, lab work & pertinent test results, reviewed documented beta blocker date and time   Airway Mallampati: II  TM Distance: >3 FB Neck ROM: full    Dental no notable dental hx.    Pulmonary former smoker,    Pulmonary exam normal breath sounds clear to auscultation       Cardiovascular Exercise Tolerance: Good negative cardio ROS   Rhythm:regular Rate:Normal     Neuro/Psych negative neurological ROS  negative psych ROS   GI/Hepatic negative GI ROS, Neg liver ROS,   Endo/Other  negative endocrine ROS  Renal/GU negative Renal ROS  negative genitourinary   Musculoskeletal  (+) Arthritis , Osteoarthritis,    Abdominal   Peds  Hematology negative hematology ROS (+)   Anesthesia Other Findings   Reproductive/Obstetrics negative OB ROS                             Anesthesia Physical Anesthesia Plan  ASA: II  Anesthesia Plan: Spinal and MAC   Post-op Pain Management:  Regional for Post-op pain   Induction:   PONV Risk Score and Plan: 3 and Ondansetron, Treatment may vary due to age or medical condition and Midazolam  Airway Management Planned: Nasal Cannula  Additional Equipment:   Intra-op Plan:   Post-operative Plan:   Informed Consent: I have reviewed the patients History and Physical, chart, labs and discussed the procedure including the risks, benefits and alternatives for the proposed anesthesia with the patient or authorized representative who has indicated his/her understanding and acceptance.     Dental Advisory Given  Plan Discussed with: CRNA, Anesthesiologist and Surgeon  Anesthesia Plan Comments: (  )        Anesthesia Quick Evaluation

## 2018-09-23 NOTE — Telephone Encounter (Signed)
Okay for handicap.  I think stair wise you should be able to go up 2-3 stairs at the time of discharge a couple days after surgery and that will increase gradually over time.  Most people do not use ramps unless they have like 10-14 stairs to get into their house

## 2018-09-23 NOTE — Telephone Encounter (Signed)
Patient called and left message on voicemail wanting to discuss need for  handicap parking sticker and had question about using a handicap ramp. She's not sure if a ramp will  be necessary, but all entrances into the home have steps.  Please advise.   ° °Pt's cb [phone] °

## 2018-09-24 ENCOUNTER — Inpatient Hospital Stay (HOSPITAL_COMMUNITY): Payer: BLUE CROSS/BLUE SHIELD | Admitting: Anesthesiology

## 2018-09-24 ENCOUNTER — Inpatient Hospital Stay (HOSPITAL_COMMUNITY)
Admission: RE | Admit: 2018-09-24 | Discharge: 2018-09-26 | DRG: 470 | Disposition: A | Discharge status: Home Health | Payer: BLUE CROSS/BLUE SHIELD | Attending: Orthopedic Surgery | Admitting: Orthopedic Surgery

## 2018-09-24 ENCOUNTER — Other Ambulatory Visit: Payer: Self-pay

## 2018-09-24 ENCOUNTER — Encounter (HOSPITAL_COMMUNITY)
Admission: RE | Disposition: A | Discharge status: Home Health | Payer: Self-pay | Source: Home / Self Care | Attending: Orthopedic Surgery

## 2018-09-24 ENCOUNTER — Encounter (HOSPITAL_COMMUNITY): Payer: Self-pay

## 2018-09-24 DIAGNOSIS — Z87891 Personal history of nicotine dependence: Secondary | ICD-10-CM

## 2018-09-24 DIAGNOSIS — Z79899 Other long term (current) drug therapy: Secondary | ICD-10-CM | POA: Diagnosis not present

## 2018-09-24 DIAGNOSIS — G8918 Other acute postprocedural pain: Secondary | ICD-10-CM | POA: Diagnosis not present

## 2018-09-24 DIAGNOSIS — M1712 Unilateral primary osteoarthritis, left knee: Secondary | ICD-10-CM | POA: Diagnosis not present

## 2018-09-24 DIAGNOSIS — Z882 Allergy status to sulfonamides status: Secondary | ICD-10-CM

## 2018-09-24 DIAGNOSIS — Z969 Presence of functional implant, unspecified: Secondary | ICD-10-CM | POA: Diagnosis not present

## 2018-09-24 DIAGNOSIS — M1710 Unilateral primary osteoarthritis, unspecified knee: Secondary | ICD-10-CM | POA: Diagnosis present

## 2018-09-24 DIAGNOSIS — Z79891 Long term (current) use of opiate analgesic: Secondary | ICD-10-CM | POA: Diagnosis not present

## 2018-09-24 DIAGNOSIS — Z96659 Presence of unspecified artificial knee joint: Secondary | ICD-10-CM

## 2018-09-24 HISTORY — PX: HARDWARE REMOVAL: SHX979

## 2018-09-24 HISTORY — PX: TOTAL KNEE ARTHROPLASTY: SHX125

## 2018-09-24 SURGERY — ARTHROPLASTY, KNEE, TOTAL
Anesthesia: Monitor Anesthesia Care | Site: Knee | Laterality: Left

## 2018-09-24 MED ORDER — SODIUM CHLORIDE 0.9% FLUSH
INTRAVENOUS | Status: DC | PRN
  Administered 2018-09-24: 20 mL

## 2018-09-24 MED ORDER — TRANEXAMIC ACID-NACL 1000-0.7 MG/100ML-% IV SOLN
INTRAVENOUS | Status: AC
  Filled 2018-09-24: qty 100

## 2018-09-24 MED ORDER — ACETAMINOPHEN 325 MG PO TABS: 325.0000 mg | ORAL_TABLET | ORAL | Status: DC | PRN

## 2018-09-24 MED ORDER — CHLORHEXIDINE GLUCONATE 4 % EX LIQD: 60.0000 mL | Freq: Once | CUTANEOUS | Status: DC

## 2018-09-24 MED ORDER — PROPOFOL 10 MG/ML IV BOLUS
INTRAVENOUS | Status: AC
  Filled 2018-09-24: qty 40

## 2018-09-24 MED ORDER — MIDAZOLAM HCL 2 MG/2ML IJ SOLN
INTRAMUSCULAR | Status: AC
  Filled 2018-09-24: qty 2

## 2018-09-24 MED ORDER — LACTATED RINGERS IV SOLN: INTRAVENOUS | Status: AC

## 2018-09-24 MED ORDER — ONDANSETRON HCL 4 MG/2ML IJ SOLN
INTRAMUSCULAR | Status: AC
  Filled 2018-09-24: qty 2

## 2018-09-24 MED ORDER — MORPHINE SULFATE (PF) 4 MG/ML IV SOLN
INTRAVENOUS | Status: AC
  Filled 2018-09-24: qty 2

## 2018-09-24 MED ORDER — 0.9 % SODIUM CHLORIDE (POUR BTL) OPTIME
TOPICAL | Status: DC | PRN
  Administered 2018-09-24: 1000 mL

## 2018-09-24 MED ORDER — ONDANSETRON HCL 4 MG/2ML IJ SOLN
4.0000 mg | Freq: Four times a day (QID) | INTRAMUSCULAR | Status: DC | PRN
  Administered 2018-09-25: 4 mg via INTRAVENOUS
  Filled 2018-09-24: qty 2

## 2018-09-24 MED ORDER — OXYCODONE HCL 5 MG PO TABS
5.0000 mg | ORAL_TABLET | ORAL | Status: DC | PRN
  Administered 2018-09-24 (×2): 10 mg via ORAL
  Administered 2018-09-25: 5 mg via ORAL
  Administered 2018-09-25 – 2018-09-26 (×6): 10 mg via ORAL
  Filled 2018-09-24 (×9): qty 2

## 2018-09-24 MED ORDER — LACTATED RINGERS IV SOLN
INTRAVENOUS | Status: DC | PRN
  Administered 2018-09-24 (×2): via INTRAVENOUS

## 2018-09-24 MED ORDER — ONDANSETRON HCL 4 MG PO TABS: 4.0000 mg | ORAL_TABLET | Freq: Four times a day (QID) | ORAL | Status: DC | PRN

## 2018-09-24 MED ORDER — GABAPENTIN 300 MG PO CAPS
300.0000 mg | ORAL_CAPSULE | Freq: Three times a day (TID) | ORAL | Status: DC
  Administered 2018-09-24 – 2018-09-26 (×6): 300 mg via ORAL
  Filled 2018-09-24 (×6): qty 1

## 2018-09-24 MED ORDER — CALCIUM CARBONATE-VITAMIN D 500-200 MG-UNIT PO TABS
1.0000 | ORAL_TABLET | Freq: Every day | ORAL | Status: DC
  Administered 2018-09-24 – 2018-09-26 (×3): 1 via ORAL
  Filled 2018-09-24 (×3): qty 1

## 2018-09-24 MED ORDER — SODIUM CHLORIDE 0.9 % IV SOLN
INTRAVENOUS | Status: DC | PRN
  Administered 2018-09-24: 50 ug/min via INTRAVENOUS

## 2018-09-24 MED ORDER — CLONIDINE HCL (ANALGESIA) 100 MCG/ML EP SOLN
EPIDURAL | Status: DC | PRN
  Administered 2018-09-24: 100 ug

## 2018-09-24 MED ORDER — METHOCARBAMOL 500 MG PO TABS
500.0000 mg | ORAL_TABLET | Freq: Four times a day (QID) | ORAL | Status: DC | PRN
  Administered 2018-09-24 – 2018-09-26 (×5): 500 mg via ORAL
  Filled 2018-09-24 (×4): qty 1

## 2018-09-24 MED ORDER — PROPOFOL 500 MG/50ML IV EMUL
INTRAVENOUS | Status: DC | PRN
  Administered 2018-09-24: 50 ug/kg/min via INTRAVENOUS

## 2018-09-24 MED ORDER — SODIUM CHLORIDE 0.9 % IR SOLN
Status: DC | PRN
  Administered 2018-09-24: 3000 mL

## 2018-09-24 MED ORDER — MIDAZOLAM HCL 5 MG/5ML IJ SOLN
INTRAMUSCULAR | Status: DC | PRN
  Administered 2018-09-24 (×2): 1 mg via INTRAVENOUS

## 2018-09-24 MED ORDER — LIDOCAINE 2% (20 MG/ML) 5 ML SYRINGE
INTRAMUSCULAR | Status: AC
  Filled 2018-09-24: qty 5

## 2018-09-24 MED ORDER — ONDANSETRON HCL 4 MG/2ML IJ SOLN
INTRAMUSCULAR | Status: DC | PRN
  Administered 2018-09-24: 4 mg via INTRAVENOUS

## 2018-09-24 MED ORDER — BUPIVACAINE HCL 0.25 % IJ SOLN
INTRAMUSCULAR | Status: DC | PRN
  Administered 2018-09-24: 30 mL

## 2018-09-24 MED ORDER — OXYCODONE HCL 5 MG/5ML PO SOLN: 5.0000 mg | Freq: Once | ORAL | Status: AC | PRN

## 2018-09-24 MED ORDER — ESTRADIOL-NORETHINDRONE ACET 0.5-0.1 MG PO TABS: 1.0000 | ORAL_TABLET | Freq: Every day | ORAL | Status: DC

## 2018-09-24 MED ORDER — BUPIVACAINE HCL (PF) 0.25 % IJ SOLN
INTRAMUSCULAR | Status: AC
  Filled 2018-09-24: qty 30

## 2018-09-24 MED ORDER — HYDROMORPHONE HCL 1 MG/ML IJ SOLN
INTRAMUSCULAR | Status: AC
  Filled 2018-09-24: qty 1

## 2018-09-24 MED ORDER — FENTANYL CITRATE (PF) 100 MCG/2ML IJ SOLN
INTRAMUSCULAR | Status: DC | PRN
  Administered 2018-09-24 (×3): 50 ug via INTRAVENOUS

## 2018-09-24 MED ORDER — METHOCARBAMOL 1000 MG/10ML IJ SOLN
500.0000 mg | Freq: Four times a day (QID) | INTRAVENOUS | Status: DC | PRN
  Filled 2018-09-24: qty 5

## 2018-09-24 MED ORDER — ASPIRIN 81 MG PO CHEW
81.0000 mg | CHEWABLE_TABLET | Freq: Two times a day (BID) | ORAL | Status: DC
  Administered 2018-09-24 – 2018-09-26 (×4): 81 mg via ORAL
  Filled 2018-09-24 (×4): qty 1

## 2018-09-24 MED ORDER — OXYCODONE HCL 5 MG PO TABS
ORAL_TABLET | ORAL | Status: AC
  Filled 2018-09-24: qty 1

## 2018-09-24 MED ORDER — METOCLOPRAMIDE HCL 5 MG/ML IJ SOLN: 5.0000 mg | Freq: Three times a day (TID) | INTRAMUSCULAR | Status: DC | PRN

## 2018-09-24 MED ORDER — MENTHOL 3 MG MT LOZG: 1.0000 | LOZENGE | OROMUCOSAL | Status: DC | PRN

## 2018-09-24 MED ORDER — MEPERIDINE HCL 50 MG/ML IJ SOLN: 6.2500 mg | INTRAMUSCULAR | Status: DC | PRN

## 2018-09-24 MED ORDER — FENTANYL CITRATE (PF) 100 MCG/2ML IJ SOLN: 25.0000 ug | INTRAMUSCULAR | Status: DC | PRN

## 2018-09-24 MED ORDER — DEXAMETHASONE SODIUM PHOSPHATE 10 MG/ML IJ SOLN
INTRAMUSCULAR | Status: DC | PRN
  Administered 2018-09-24: 10 mg via INTRAVENOUS

## 2018-09-24 MED ORDER — TRANEXAMIC ACID 1000 MG/10ML IV SOLN
2000.0000 mg | INTRAVENOUS | Status: DC
  Filled 2018-09-24: qty 20

## 2018-09-24 MED ORDER — METHOCARBAMOL 500 MG PO TABS
ORAL_TABLET | ORAL | Status: AC
  Filled 2018-09-24: qty 1

## 2018-09-24 MED ORDER — PHENOL 1.4 % MT LIQD: 1.0000 | OROMUCOSAL | Status: DC | PRN

## 2018-09-24 MED ORDER — CLONIDINE HCL (ANALGESIA) 100 MCG/ML EP SOLN
150.0000 ug | Freq: Once | EPIDURAL | Status: AC
  Administered 2018-09-24: 1 mL via INTRA_ARTICULAR
  Filled 2018-09-24: qty 10

## 2018-09-24 MED ORDER — FENTANYL CITRATE (PF) 250 MCG/5ML IJ SOLN
INTRAMUSCULAR | Status: AC
  Filled 2018-09-24: qty 5

## 2018-09-24 MED ORDER — ADULT MULTIVITAMIN W/MINERALS CH
1.0000 | ORAL_TABLET | Freq: Every day | ORAL | Status: DC
  Administered 2018-09-25 – 2018-09-26 (×2): 1 via ORAL
  Filled 2018-09-24 (×2): qty 1

## 2018-09-24 MED ORDER — DEXAMETHASONE SODIUM PHOSPHATE 10 MG/ML IJ SOLN
INTRAMUSCULAR | Status: AC
  Filled 2018-09-24: qty 1

## 2018-09-24 MED ORDER — OXYCODONE HCL 5 MG PO TABS
5.0000 mg | ORAL_TABLET | Freq: Once | ORAL | Status: AC | PRN
  Administered 2018-09-24: 5 mg via ORAL

## 2018-09-24 MED ORDER — ACETAMINOPHEN 160 MG/5ML PO SOLN: 325.0000 mg | ORAL | Status: DC | PRN

## 2018-09-24 MED ORDER — ACETAMINOPHEN 325 MG PO TABS: 325.0000 mg | ORAL_TABLET | Freq: Four times a day (QID) | ORAL | Status: DC | PRN

## 2018-09-24 MED ORDER — DOCUSATE SODIUM 100 MG PO CAPS
100.0000 mg | ORAL_CAPSULE | Freq: Two times a day (BID) | ORAL | Status: DC
  Administered 2018-09-24 – 2018-09-26 (×5): 100 mg via ORAL
  Filled 2018-09-24 (×5): qty 1

## 2018-09-24 MED ORDER — MORPHINE SULFATE (PF) 4 MG/ML IV SOLN
INTRAVENOUS | Status: DC | PRN
  Administered 2018-09-24: 8 mg via INTRAMUSCULAR

## 2018-09-24 MED ORDER — ACETAMINOPHEN 10 MG/ML IV SOLN
INTRAVENOUS | Status: DC | PRN
  Administered 2018-09-24: 1000 mg via INTRAVENOUS

## 2018-09-24 MED ORDER — CEFAZOLIN SODIUM-DEXTROSE 2-4 GM/100ML-% IV SOLN
2.0000 g | Freq: Four times a day (QID) | INTRAVENOUS | Status: AC
  Administered 2018-09-24 (×2): 2 g via INTRAVENOUS
  Filled 2018-09-24 (×2): qty 100

## 2018-09-24 MED ORDER — TRANEXAMIC ACID 1000 MG/10ML IV SOLN
2000.0000 mg | INTRAVENOUS | Status: AC
  Administered 2018-09-24: 2000 mg via TOPICAL
  Filled 2018-09-24: qty 20

## 2018-09-24 MED ORDER — ONDANSETRON HCL 4 MG/2ML IJ SOLN: 4.0000 mg | Freq: Once | INTRAMUSCULAR | Status: DC | PRN

## 2018-09-24 MED ORDER — ROPIVACAINE HCL 7.5 MG/ML IJ SOLN
INTRAMUSCULAR | Status: DC | PRN
  Administered 2018-09-24: 30 mL via PERINEURAL

## 2018-09-24 MED ORDER — HYDROMORPHONE HCL 1 MG/ML IJ SOLN
0.5000 mg | INTRAMUSCULAR | Status: DC | PRN
  Administered 2018-09-24 – 2018-09-25 (×6): 0.5 mg via INTRAVENOUS
  Filled 2018-09-24 (×5): qty 1

## 2018-09-24 MED ORDER — BUPIVACAINE LIPOSOME 1.3 % IJ SUSP
20.0000 mL | INTRAMUSCULAR | Status: AC
  Administered 2018-09-24: 20 mL
  Filled 2018-09-24: qty 20

## 2018-09-24 MED ORDER — METOCLOPRAMIDE HCL 5 MG PO TABS: 5.0000 mg | ORAL_TABLET | Freq: Three times a day (TID) | ORAL | Status: DC | PRN

## 2018-09-24 MED ORDER — ACETAMINOPHEN 10 MG/ML IV SOLN
INTRAVENOUS | Status: AC
  Filled 2018-09-24: qty 100

## 2018-09-24 SURGICAL SUPPLY — 82 items
BAG DECANTER FOR FLEXI CONT (MISCELLANEOUS) ×3 IMPLANT
BANDAGE ESMARK 6X9 LF (GAUZE/BANDAGES/DRESSINGS) ×1 IMPLANT
BLADE SAG 18X100X1.27 (BLADE) ×3 IMPLANT
BLADE SAW SGTL 13.0X1.19X90.0M (BLADE) IMPLANT
BNDG CMPR 9X6 STRL LF SNTH (GAUZE/BANDAGES/DRESSINGS) ×1
BNDG CMPR MED 10X6 ELC LF (GAUZE/BANDAGES/DRESSINGS) ×1
BNDG CMPR MED 15X6 ELC VLCR LF (GAUZE/BANDAGES/DRESSINGS) ×1
BNDG COHESIVE 6X5 TAN STRL LF (GAUZE/BANDAGES/DRESSINGS) ×3 IMPLANT
BNDG ELASTIC 6X10 VLCR STRL LF (GAUZE/BANDAGES/DRESSINGS) ×2 IMPLANT
BNDG ELASTIC 6X15 VLCR STRL LF (GAUZE/BANDAGES/DRESSINGS) ×3 IMPLANT
BNDG ESMARK 6X9 LF (GAUZE/BANDAGES/DRESSINGS) ×3
BOWL SMART MIX CTS (DISPOSABLE) IMPLANT
BSPLAT TIB 4 KN TRITANIUM (Knees) ×1 IMPLANT
CLOSURE STERI-STRIP 1/2X4 (GAUZE/BANDAGES/DRESSINGS) ×1
CLOSURE WOUND 1/2 X4 (GAUZE/BANDAGES/DRESSINGS) ×2
CLSR STERI-STRIP ANTIMIC 1/2X4 (GAUZE/BANDAGES/DRESSINGS) ×1 IMPLANT
COMP FEMORAL TRIATHLON SZ3 (Joint) ×3 IMPLANT
COMPONENT FEMRL TRIATHLON SZ3 (Joint) IMPLANT
CONT SPECI 4OZ STER CLIK (MISCELLANEOUS) ×3 IMPLANT
COVER SURGICAL LIGHT HANDLE (MISCELLANEOUS) ×3 IMPLANT
COVER WAND RF STERILE (DRAPES) ×3 IMPLANT
CUFF TOURNIQUET SINGLE 34IN LL (TOURNIQUET CUFF) ×3 IMPLANT
CUFF TOURNIQUET SINGLE 44IN (TOURNIQUET CUFF) IMPLANT
DECANTER SPIKE VIAL GLASS SM (MISCELLANEOUS) ×3 IMPLANT
DRAPE INCISE IOBAN 66X45 STRL (DRAPES) IMPLANT
DRAPE ORTHO SPLIT 77X108 STRL (DRAPES) ×9
DRAPE SURG ORHT 6 SPLT 77X108 (DRAPES) ×3 IMPLANT
DRAPE U-SHAPE 47X51 STRL (DRAPES) ×3 IMPLANT
DRSG AQUACEL AG ADV 3.5X14 (GAUZE/BANDAGES/DRESSINGS) ×2 IMPLANT
DURAPREP 26ML APPLICATOR (WOUND CARE) ×6 IMPLANT
ELECT CAUTERY BLADE 6.4 (BLADE) ×3 IMPLANT
ELECT REM PT RETURN 9FT ADLT (ELECTROSURGICAL) ×3
ELECTRODE REM PT RTRN 9FT ADLT (ELECTROSURGICAL) ×1 IMPLANT
GAUZE SPONGE 4X4 12PLY STRL (GAUZE/BANDAGES/DRESSINGS) ×3 IMPLANT
GLOVE BIOGEL PI IND STRL 7.5 (GLOVE) ×1 IMPLANT
GLOVE BIOGEL PI IND STRL 8 (GLOVE) ×1 IMPLANT
GLOVE BIOGEL PI INDICATOR 7.5 (GLOVE) ×2
GLOVE BIOGEL PI INDICATOR 8 (GLOVE) ×2
GLOVE ECLIPSE 7.0 STRL STRAW (GLOVE) ×3 IMPLANT
GLOVE SURG ORTHO 8.0 STRL STRW (GLOVE) ×3 IMPLANT
GOWN STRL REUS W/ TWL LRG LVL3 (GOWN DISPOSABLE) ×3 IMPLANT
GOWN STRL REUS W/TWL LRG LVL3 (GOWN DISPOSABLE) ×9
HANDPIECE INTERPULSE COAX TIP (DISPOSABLE) ×3
HOOD PEEL AWAY FLYTE STAYCOOL (MISCELLANEOUS) ×9 IMPLANT
IMMOBILIZER KNEE 20 (SOFTGOODS) IMPLANT
IMMOBILIZER KNEE 22 (SOFTGOODS) ×2 IMPLANT
IMMOBILIZER KNEE 22 UNIV (SOFTGOODS) IMPLANT
IMMOBILIZER KNEE 24 THIGH 36 (MISCELLANEOUS) IMPLANT
IMMOBILIZER KNEE 24 UNIV (MISCELLANEOUS)
INSERT TIBIAL BEARING 11MM SZ4 (Knees) ×2 IMPLANT
KIT BASIN OR (CUSTOM PROCEDURE TRAY) ×3 IMPLANT
KIT TURNOVER KIT B (KITS) ×3 IMPLANT
KNEE PATELLA ASYMMETRIC 10X32 (Knees) ×2 IMPLANT
KNEE TIBIAL COMP TRI SZ4 (Knees) ×2 IMPLANT
MANIFOLD NEPTUNE II (INSTRUMENTS) ×3 IMPLANT
NDL SAFETY ECLIPSE 18X1.5 (NEEDLE) ×1 IMPLANT
NEEDLE 22X1 1/2 (OR ONLY) (NEEDLE) ×6 IMPLANT
NEEDLE HYPO 18GX1.5 SHARP (NEEDLE) ×3
NS IRRIG 1000ML POUR BTL (IV SOLUTION) ×6 IMPLANT
PACK TOTAL JOINT (CUSTOM PROCEDURE TRAY) ×3 IMPLANT
PAD ARMBOARD 7.5X6 YLW CONV (MISCELLANEOUS) ×6 IMPLANT
PAD CAST 4YDX4 CTTN HI CHSV (CAST SUPPLIES) ×1 IMPLANT
PADDING CAST COTTON 4X4 STRL (CAST SUPPLIES) ×3
PADDING CAST COTTON 6X4 STRL (CAST SUPPLIES) ×3 IMPLANT
SET HNDPC FAN SPRY TIP SCT (DISPOSABLE) ×1 IMPLANT
STRIP CLOSURE SKIN 1/2X4 (GAUZE/BANDAGES/DRESSINGS) ×4 IMPLANT
SUCTION FRAZIER HANDLE 10FR (MISCELLANEOUS) ×2
SUCTION TUBE FRAZIER 10FR DISP (MISCELLANEOUS) ×1 IMPLANT
SUT ETHILON 3 0 FSL (SUTURE) ×12 IMPLANT
SUT MNCRL AB 3-0 PS2 18 (SUTURE) ×3 IMPLANT
SUT VIC AB 0 CT1 27 (SUTURE) ×9
SUT VIC AB 0 CT1 27XBRD ANBCTR (SUTURE) ×3 IMPLANT
SUT VIC AB 1 CT1 27 (SUTURE) ×18
SUT VIC AB 1 CT1 27XBRD ANBCTR (SUTURE) ×5 IMPLANT
SUT VIC AB 2-0 CT1 27 (SUTURE) ×18
SUT VIC AB 2-0 CT1 TAPERPNT 27 (SUTURE) ×4 IMPLANT
SYR 30ML LL (SYRINGE) ×9 IMPLANT
SYR TB 1ML LUER SLIP (SYRINGE) ×3 IMPLANT
TOWEL OR 17X24 6PK STRL BLUE (TOWEL DISPOSABLE) ×6 IMPLANT
TOWEL OR 17X26 10 PK STRL BLUE (TOWEL DISPOSABLE) ×6 IMPLANT
TRAY CATH 16FR W/PLASTIC CATH (SET/KITS/TRAYS/PACK) IMPLANT
WATER STERILE IRR 1000ML POUR (IV SOLUTION) IMPLANT

## 2018-09-24 NOTE — Progress Notes (Signed)
Physical Therapy Evaluation Patient Details Name: Jennifer Whitehead MRN: [id number] DOB: 03-Nov-1950 Today's Date: 09/24/2018   History of Present Illness  Patient is 68 y/o female s/p L TKA with hardware removal. PMH includes bilateral bunionectomy and L ACL repair.   Clinical Impression  Patient admitted to hospital secondary to problems above with deficits below. Patient tolerated session well with ability to ambulate to bathroom and in hallway with RW. Patient required supervision to min guard with RW for transfers and ambulation. Reviewed and educated about HEP and knee precautions. Patient will benefit from acute physical therapy services to maximize independence and safety with functional mobility.      Follow Up Recommendations Follow surgeon's recommendation for DC plan and follow-up therapies    Equipment Recommendations  None recommended by PT    Recommendations for Other Services       Precautions / Restrictions Precautions Precautions: Knee Precaution Booklet Issued: Yes (comment) Precaution Comments: reviewed knee precautions with patient Restrictions Weight Bearing Restrictions: Yes LLE Weight Bearing: Weight bearing as tolerated      Mobility  Bed Mobility Overal bed mobility: Modified Independent             General bed mobility comments: Patient required increased time for bed mobility   Transfers Overall transfer level: Needs assistance Equipment used: Rolling walker (2 wheeled) Transfers: Sit to/from Stand Sit to Stand: Supervision         General transfer comment: Patient required supervision for sit to stand transfer x2 with RW for safety. Verbal cues for hand placement on RW.   Ambulation/Gait Ambulation/Gait assistance: Supervision;Min guard Gait Distance (Feet): 75 Feet Assistive device: Rolling walker (2 wheeled) Gait Pattern/deviations: Step-through pattern;Decreased step length - right;Decreased stance time - left;Decreased weight shift  to left;Antalgic Gait velocity: decreased Gait velocity interpretation: <1.8 ft/sec, indicate of risk for recurrent falls General Gait Details: Patient ambulated with supervision to min guard to bathroom and in hallway with RW for safety. No LOB or knee buckling noted.   Stairs            Wheelchair Mobility    Modified Rankin (Stroke Patients Only)       Balance Overall balance assessment: Needs assistance Sitting-balance support: No upper extremity supported;Feet supported Sitting balance-Leahy Scale: Good     Standing balance support: Bilateral upper extremity supported;No upper extremity supported Standing balance-Leahy Scale: Fair Standing balance comment: Able to maintain static standing at sink without UE support                              Pertinent Vitals/Pain Pain Assessment: 0-10 Pain Score: 3  Pain Location: L knee  Pain Descriptors / Indicators: Discomfort;Operative site guarding Pain Intervention(s): Limited activity within patient's tolerance;Monitored during session;Premedicated before session;Ice applied    Home Living Family/patient expects to be discharged to:: Private residence Living Arrangements: Spouse/significant other Available Help at Discharge: Family;Available 24 hours/day Type of Home: House Home Access: Stairs to enter(2 steps no rail; 5 steps with rail on R) Entrance Stairs-Rails: None Entrance Stairs-Number of Steps: 2 Home Layout: One level Home Equipment: Walker - 2 wheels;Bedside commode;Shower seat - built in      Prior Function Level of Independence: Independent               Hand Dominance        Extremity/Trunk Assessment   Upper Extremity Assessment Upper Extremity Assessment: Overall WFL for tasks assessed    Lower  Extremity Assessment Lower Extremity Assessment: LLE deficits/detail LLE Deficits / Details: LLE deficits consistent with post op pain and weakness    Cervical / Trunk  Assessment Cervical / Trunk Assessment: Normal  Communication   Communication: No difficulties  Cognition Arousal/Alertness: Awake/alert Behavior During Therapy: WFL for tasks assessed/performed Overall Cognitive Status: Within Functional Limits for tasks assessed                                        General Comments General comments (skin integrity, edema, etc.): Nurse in room at beginning in session. Educated about completing heel slides once she returns to bed tonight.     Exercises Total Joint Exercises Ankle Circles/Pumps: AROM;Both;20 reps;Seated Quad Sets: AROM;Left;10 reps;Seated   Assessment/Plan    PT Assessment Patient needs continued PT services  PT Problem List Decreased strength;Decreased range of motion;Decreased activity tolerance;Decreased balance;Decreased mobility;Decreased knowledge of use of DME;Decreased knowledge of precautions;Pain       PT Treatment Interventions DME instruction;Gait training;Stair training;Functional mobility training;Therapeutic activities;Therapeutic exercise;Balance training;Patient/family education    PT Goals (Current goals can be found in the Care Plan section)  Acute Rehab PT Goals Patient Stated Goal: go home PT Goal Formulation: With patient Time For Goal Achievement: 10/08/18 Potential to Achieve Goals: Good    Frequency 7X/week   Barriers to discharge        Co-evaluation               AM-PAC PT "6 Clicks" Mobility  Outcome Measure Help needed turning from your back to your side while in a flat bed without using bedrails?: None Help needed moving from lying on your back to sitting on the side of a flat bed without using bedrails?: None Help needed moving to and from a bed to a chair (including a wheelchair)?: None Help needed standing up from a chair using your arms (e.g., wheelchair or bedside chair)?: None Help needed to walk in hospital room?: A Little Help needed climbing 3-5 steps with  a railing? : A Lot 6 Click Score: 21    End of Session Equipment Utilized During Treatment: Gait belt Activity Tolerance: Patient tolerated treatment well Patient left: in chair;with call bell/phone within reach Nurse Communication: Mobility status PT Visit Diagnosis: Muscle weakness (generalized) (M62.81);Difficulty in walking, not elsewhere classified (R26.2);Pain Pain - Right/Left: Left Pain - part of body: Knee    Time: 4944-9675 PT Time Calculation (min) (ACUTE ONLY): 25 min   Charges:   PT Evaluation $PT Eval Low Complexity: 1 Low PT Treatments $Gait Training: 8-22 mins        Vanessa Ralphs, SPT  Vanessa Ralphs 09/24/2018, 5:54 PM

## 2018-09-24 NOTE — Op Note (Signed)
Jennifer Whitehead, DUFFY MEDICAL RECORD [id number] ACCOUNT [id number] DATE OF BIRTH:10/27/1950 FACILITY: [facility] LOCATION: [facility] PHYSICIAN:GREGORY Randel Pigg, MD  OPERATIVE REPORT  DATE OF PROCEDURE:  09/24/2018  PREOPERATIVE DIAGNOSIS:  Left knee arthritis and retained hardware.  POSTOPERATIVE DIAGNOSIS:  Left knee arthritis and retained hardware.  PROCEDURE:  Left total knee replacement using Stryker press-fit Triathlon knee, 3 femur, 4 tibia, 11 mm polyethylene insert, posterior cruciate-retaining deep dish with 32 mm 3-peg patella.  SURGEON:  Meredith Pel, MD  ASSISTANT:  Laure Kidney, RNFA  INDICATIONS:  The patient is a 68 year old patient with left knee arthritis who presents for operative management after failure of conservative management and explanation of risks and benefits.  PROCEDURE IN DETAIL:  The patient was brought to the operating room where spinal anesthetic was induced.  Preoperative antibiotics were administered.  Timeout was called.  Left leg was prescrubbed with alcohol and Betadine and allowed to air dry.   Prepped with DuraPrep solution and draped in sterile manner.  [name] was used to cover the operative field.  The leg was elevated, exsanguinated with the Esmarch wrap to 300 mmHg.  Anterior approach to the knee was made.  The ACL incision was utilized,  which was placed in the distal part of the incision slightly more medial than usual.  Full-thickness skin flaps were maintained.  Attention was first directed towards removing the screw from the tibia.  The screw was well fixed.  The FiberWire sutures  were cut.  There was some mild difficulty encountered finding the screwdriver to fit this Arthrex tibial post.  A kit was found which contained a screwdriver.  All in all, the screw was removed.  Thorough irrigation of that area was performed.  Attention  was then directed towards exposing the knee.  A medial arthrotomy was made and marked with a #1 Vicryl  suture.  Severe arthritis was present in the lateral, followed by patellofemoral, followed by medial compartment.  Minimal dissection was performed  medially.  At this time, the patella was everted.  The fat pad was partially resected.  The intramedullary alignment was then used to create the tibial cut, which was perpendicular to the mechanical axis.  Collaterals and posterior neurovascular  structures were protected including the PCL.  At this time, the 9 mm cut was made off the least affected lateral tibial plateau.  The tibial cut was flushed.  Made with a 3-degree slope with slightly more slope added, about 2 degrees.  Attention was then  directed towards the femur.  The femur did have some wear on both the medial and lateral femoral condyle.  The intramedullary alignment was used, and the femur was cut in 5 degrees valgus.  Following that, an 8 mm cut was made.  The sizing guide was  then placed, and it was placed parallel to the transepicondylar axis in about 3 degrees of external rotation.  There was not excessive wear on the lateral femoral condyle posteriorly.  At this time, the cut was made.  Chamfer anterior and posterior cuts.   Trial components were placed after sizing the tibia to a size 4.  The patella then tracked somewhat laterally consistent with her valgus more than medial compartment arthritis.  Release was performed which improved it.  The rotational alignment of the  tibia and the femur appeared visually intact.  Trial reduction was performed with both the 3 femur, 4 tibia, 11 mm polyethylene implant insert, and 32 mm 3-peg patella.  The patella was cut  from 20 down to 12.  There was wear on the patella.  The patient  had full extension, full flexion, and good patellar tracking after the release.  Thorough irrigation was performed.  Instruments were removed.  Exparel was injected into the capsular region.  Tranexamic acid sponge was then placed for 3 minutes.  This  was removed, and  then the components were press-fit into good position with good press-fit achieved.  Same stability parameters were maintained.  Tourniquet was released.  Bleeding points encountered controlled using electrocautery.  Thorough irrigation  was performed.  The knee was then closed over a bolster using #1 Vicryl suture followed by interrupted inverted 0 Vicryl suture, 2-0 Vicryl suture, and we used nylon on the skin because distally at the region of the hardware removal, the skin was very  thin and the subcutaneous tissue was very prominent.  Nylon sutures were utilized for this region.  Aquacel dressing, Ace wrap, and knee immobilizer were placed.  The patient tolerated the procedure well without immediate complications.  He was  transferred to the recovery room in stable condition.  LN/NUANCE  D:09/24/2018 T:09/24/2018 JOB:[id number]

## 2018-09-24 NOTE — Anesthesia Procedure Notes (Addendum)
Anesthesia Regional Block: Adductor canal block   Pre-Anesthetic Checklist: ,, timeout performed, Correct Patient, Correct Site, Correct Laterality, Correct Procedure, Correct Position, site marked, Risks and benefits discussed,  Surgical consent,  Pre-op evaluation,  At surgeon's request and post-op pain management  Laterality: Left  Prep: chloraprep       Needles:  Injection technique: Single-shot  Needle Type: Echogenic Stimulator Needle     Needle Length: 5cm  Needle Gauge: 22     Additional Needles:   Procedures:, nerve stimulator,,, ultrasound used (permanent image in chart),,,,  Narrative:  Start time: 09/24/2018 7:08 AM End time: 09/24/2018 7:29 AM Injection made incrementally with aspirations every 5 mL.  Performed by: Personally  Anesthesiologist: Bethena Midget, MD  Additional Notes: Functioning IV was confirmed and monitors were applied.  A 71mm 22ga Arrow echogenic stimulator needle was used. Sterile prep and drape,hand hygiene and sterile gloves were used. Ultrasound guidance: relevant anatomy identified, needle position confirmed, local anesthetic spread visualized around nerve(s)., vascular puncture avoided.  Image printed for medical record. Negative aspiration and negative test dose prior to incremental administration of local anesthetic. The patient tolerated the procedure well.

## 2018-09-24 NOTE — Brief Op Note (Signed)
09/24/2018  11:02 AM  PATIENT:  Jennifer Whitehead  68 y.o. female  PRE-OPERATIVE DIAGNOSIS:  LEFT KNEE OSTEOARTHRITIS  POST-OPERATIVE DIAGNOSIS:  LEFT KNEE OSTEOARTHRITIS; RETAINED SCREW LEFT KNEE  PROCEDURE:  Procedure(s): LEFT TOTAL KNEE ARTHROPLASTY Hardware Removal Left Knee  SURGEON:  Surgeon(s): Cammy Copa, MD  ASSISTANT: Patrick Jupiter rnfa  ANESTHESIA:   spinal  EBL: 50 ml    Total I/O In: 1500 [I.V.:1300; IV Piggyback:200] Out: 50 [Blood:50]  BLOOD ADMINISTERED: none  DRAINS: none   LOCAL MEDICATIONS USED:  Marcaine mso4 clonidine  SPECIMEN:  No Specimen  COUNTS:  YES  TOURNIQUET:   Total Tourniquet Time Documented: Thigh (Left) - 110 minutes Total: Thigh (Left) - 110 minutes   DICTATION: .Other Dictation: Dictation Number [id number]  PLAN OF CARE: Admit to inpatient   PATIENT DISPOSITION:  PACU - hemodynamically stable

## 2018-09-24 NOTE — Anesthesia Postprocedure Evaluation (Signed)
Anesthesia Post Note  Patient: Jennifer Whitehead  Procedure(s) Performed: LEFT TOTAL KNEE ARTHROPLASTY (Left Knee) Hardware Removal Left Knee (Left Knee)     Patient location during evaluation: PACU Anesthesia Type: Regional Level of consciousness: oriented and awake and alert Pain management: pain level controlled Vital Signs Assessment: post-procedure vital signs reviewed and stable Respiratory status: spontaneous breathing, respiratory function stable and patient connected to nasal cannula oxygen Cardiovascular status: blood pressure returned to baseline and stable Postop Assessment: no headache, no backache and no apparent nausea or vomiting Anesthetic complications: no    Last Vitals:  Vitals:   09/24/18 1157 09/24/18 1212  BP: (!) 102/54 (!) 101/56  Pulse: 69 66  Resp: 16 12  Temp:    SpO2: 97% 97%    Last Pain:  Vitals:   09/24/18 1212  TempSrc:   PainSc: 0-No pain                 Damoni Causby

## 2018-09-24 NOTE — OR Nursing (Signed)
1055: in&out cath=1000cc cyu, per protocol.

## 2018-09-24 NOTE — Transfer of Care (Signed)
Immediate Anesthesia Transfer of Care Note  Patient: Jennifer Whitehead  Procedure(s) Performed: LEFT TOTAL KNEE ARTHROPLASTY (Left Knee) Hardware Removal Left Knee (Left Knee)  Patient Location: PACU  Anesthesia Type:MAC, Regional and Spinal  Level of Consciousness: awake, alert , oriented and sedated  Airway & Oxygen Therapy: Patient Spontanous Breathing and Patient connected to nasal cannula oxygen  Post-op Assessment: Report given to RN, Post -op Vital signs reviewed and stable and Patient moving all extremities  Post vital signs: Reviewed and stable  Last Vitals:  Vitals Value Taken Time  BP 102/51 09/24/2018 11:14 AM  Temp 36.4 C 09/24/2018 11:12 AM  Pulse 67 09/24/2018 11:20 AM  Resp 9 09/24/2018 11:20 AM  SpO2 99 % 09/24/2018 11:20 AM  Vitals shown include unvalidated device data.  Last Pain:  Vitals:   09/24/18 1112  TempSrc:   PainSc: 0-No pain         Complications: No apparent anesthesia complications

## 2018-09-24 NOTE — Progress Notes (Signed)
Orthopedic Tech Progress Note Patient Details:  [name] 1950/12/11 [id number] CPM [place] [facility]     Post Interventions Patient Tolerated: Well Instructions Provided: Adjustment of device, Care of device   Duvid Smalls J Dunbar Buras 09/24/2018, 11:47 AM

## 2018-09-24 NOTE — H&P (Signed)
TOTAL KNEE ADMISSION H&P  Patient is being admitted for left total knee arthroplasty.  Subjective:  Chief Complaint:left knee pain.  HPI: Jennifer Whitehead, 68 y.o. female, has a history of pain and functional disability in the left knee due to arthritis and has failed non-surgical conservative treatments for greater than 12 weeks to includeNSAID's and/or analgesics, corticosteriod injections, viscosupplementation injections and activity modification.  Onset of symptoms was gradual, starting >10 years ago with gradually worsening course since that time. The patient noted prior procedures on the knee to include  menisectomy and ACL reconstruction on the left knee(s).  Patient currently rates pain in the left knee(s) at 8 out of 10 with activity. Patient has night pain, worsening of pain with activity and weight bearing, pain that interferes with activities of daily living, pain with passive range of motion and joint swelling.  Patient has evidence of subchondral sclerosis and joint space narrowing by imaging studies. This patient has had Prior ACL reconstruction with retained hardware which will require removal in order to facilitate intramedullary alignment on the tibial side. There is no active infection.  There are no active problems to display for this patient.  Past Medical History:  Diagnosis Date  . Arthritis    in both knees    Past Surgical History:  Procedure Laterality Date  . ANTERIOR CRUCIATE LIGAMENT REPAIR Left   . BUNIONECTOMY Bilateral   . dental bone grafts    . RADIAL HEAD EXCISION Left     Current Facility-Administered Medications  Medication Dose Route Frequency Provider Last Rate Last Dose  . ceFAZolin (ANCEF) IVPB 2g/100 mL premix  2 g Intravenous To SS-Surg Cammy Copa, MD      . chlorhexidine (HIBICLENS) 4 % liquid 4 application  60 mL Topical Once Cammy Copa, MD      . chlorhexidine (HIBICLENS) 4 % liquid 4 application  60 mL Topical Once August Saucer  Corrie Mckusick, MD       Allergies  Allergen Reactions  . Sulfa Antibiotics Hives    Social History   Tobacco Use  . Smoking status: Former Smoker    Packs/day: 0.25    Years: 4.00    Pack years: 1.00    Types: Cigarettes    Last attempt to quit: 09/16/1976    Years since quitting: 42.0  . Smokeless tobacco: Never Used  Substance Use Topics  . Alcohol use: Yes    Comment: several times a week    History reviewed. No pertinent family history.   Review of Systems  Musculoskeletal: Positive for joint pain.  All other systems reviewed and are negative.   Objective:  Physical Exam  Constitutional: She appears well-developed.  HENT:  Head: Normocephalic.  Eyes: Pupils are equal, round, and reactive to light.  Neck: Normal range of motion.  Cardiovascular: Normal rate.  Respiratory: Effort normal.  Neurological: She is alert.  Skin: Skin is warm.  Psychiatric: She has a normal mood and affect.  Left knee examination demonstrates intact skin.  Pedal pulses palpable.  Range of motion is full extension to about 115 of flexion.  Collaterals are stable.  There is some ACL laxity.  Patellofemoral crepitus also present.  No groin pain with internal X rotation of the leg  Vital signs in last 24 hours: Temp:  [97.7 F (36.5 C)] 97.7 F (36.5 C) (01/28 0548) Pulse Rate:  [81] 81 (01/28 0548) Resp:  [18] 18 (01/28 0548) BP: (133)/(69) 133/69 (01/28 0548) SpO2:  [96 %] 96 % (  01/28 0548) Weight:  [79.4 kg] 79.4 kg (01/28 0548)  Labs:   Estimated body mass index is 30.04 kg/m as calculated from the following:   Height as of this encounter: 5\' 4"  (1.626 m).   Weight as of this encounter: 79.4 kg.   Imaging Review Plain radiographs demonstrate mild degenerative joint disease of the left knee(s). The overall alignment isneutral. The bone quality appears to be good for age and reported activity level.   Preoperative templating of the joint replacement has been completed,  documented, and submitted to the Operating Room personnel in order to optimize intra-operative equipment management.   Anticipated LOS equal to or greater than 2 midnights due to - Age 56 and older with one or more of the following:  - Obesity  - Expected need for hospital services (PT, OT, Nursing) required for safe  discharge  - Anticipated need for postoperative skilled nursing care or inpatient rehab  - Active co-morbidities: None OR   - Unanticipated findings during/Post Surgery: None  - Patient is a high risk of re-admission due to: None     Assessment/Plan:  End stage arthritis, left knee   The patient history, physical examination, clinical judgment of the provider and imaging studies are consistent with end stage degenerative joint disease of the left knee(s) and total knee arthroplasty is deemed medically necessary. The treatment options including medical management, injection therapy arthroscopy and arthroplasty were discussed at length. The risks and benefits of total knee arthroplasty were presented and reviewed. The risks due to aseptic loosening, infection, stiffness, patella tracking problems, thromboembolic complications and other imponderables were discussed. The patient acknowledged the explanation, agreed to proceed with the plan and consent was signed. Patient is being admitted for inpatient treatment for surgery, pain control, PT, OT, prophylactic antibiotics, VTE prophylaxis, progressive ambulation and ADL's and discharge planning. The patient is planning to be discharged home with home health services.  Patient's BMI is 30.  This is slightly increased and will have marginal consequences on her perioperative course.

## 2018-09-24 NOTE — Telephone Encounter (Signed)
LMOM of below message for patient

## 2018-09-25 ENCOUNTER — Encounter (HOSPITAL_COMMUNITY): Payer: Self-pay | Admitting: Orthopedic Surgery

## 2018-09-25 NOTE — Progress Notes (Signed)
Subjective: Pt stable - pain ok   Objective: Vital signs in last 24 hours: Temp:  [97.3 F (36.3 C)-98 F (36.7 C)] 97.7 F (36.5 C) (01/29 0341) Pulse Rate:  [62-81] 71 (01/29 0341) Resp:  [11-18] 14 (01/29 0341) BP: (95-112)/(52-70) 112/61 (01/29 0341) SpO2:  [96 %-99 %] 99 % (01/29 0341)  Intake/Output from previous day: 01/28 0701 - 01/29 0700 In: 2531.9 [P.O.:600; I.V.:1501.3; IV Piggyback:230.7] Out: 1100 [Urine:1000; Blood:100] Intake/Output this shift: No intake/output data recorded.  Exam:  Compartment soft  Labs: No results for input(s): HGB in the last 72 hours. No results for input(s): WBC, RBC, HCT, PLT in the last 72 hours. No results for input(s): NA, K, CL, CO2, BUN, CREATININE, GLUCOSE, CALCIUM in the last 72 hours. No results for input(s): LABPT, INR in the last 72 hours.  Assessment/Plan: Plan pt today and this afternoon - possible dc home if ok with stairs and pain ok - has rx already - needs dressing change before dc   [employer] 09/25/2018, 8:26 AM

## 2018-09-25 NOTE — Progress Notes (Signed)
Physical Therapy Treatment Patient Details Name: Jennifer BubaMildred Whitehead MRN: [id number] DOB: 1951/04/20 Today's Date: 09/25/2018    History of Present Illness Patient is 68 y/o female s/p L TKA with hardware removal. PMH includes bilateral bunionectomy and L ACL repair.     PT Comments    Continuing work on functional mobility and activity tolerance;  Painful L knee with flexion therex, but showing good knee control against gravity; Notable for decr activity tolerance, in particular, standing tolerance this session; Unable to progress amb due to nausea, dizziness, and likely hypotension (unable to stand long enough to get a 3 minute standing BP); Notified Dr. August Saucerean that I am hesitant for dc today due to decr upright standing tolerance today   Follow Up Recommendations  Follow surgeon's recommendation for DC plan and follow-up therapies     Equipment Recommendations  None recommended by PT    Recommendations for Other Services       Precautions / Restrictions Precautions Precautions: Knee Precaution Comments: Pt educated to not allow any pillow or bolster under knee for healing with optimal range of motion. Restrictions LLE Weight Bearing: Weight bearing as tolerated    Mobility  Bed Mobility Overal bed mobility: Modified Independent             General bed mobility comments: Patient required increased time for bed mobility   Transfers Overall transfer level: Needs assistance Equipment used: Rolling walker (2 wheeled) Transfers: Sit to/from Stand Sit to Stand: Min guard         General transfer comment: Cues for hand placement and safety; good rise; cues also to self-monitor for activity tolerance  Ambulation/Gait             General Gait Details: Held amb today due to significant dizziness with upright activity; RN notified   Stairs             Wheelchair Mobility    Modified Rankin (Stroke Patients Only)       Balance     Sitting balance-Leahy  Scale: Good       Standing balance-Leahy Scale: Fair                              Cognition Arousal/Alertness: Awake/alert Behavior During Therapy: WFL for tasks assessed/performed Overall Cognitive Status: Within Functional Limits for tasks assessed                                        Exercises Total Joint Exercises Ankle Circles/Pumps: AROM;Both;10 reps;Supine Quad Sets: AROM;Left;10 reps Short Arc Quad: AROM;Left;10 reps Heel Slides: AROM;AAROM;Left;10 reps Straight Leg Raises: AAROM;Left;10 reps Goniometric ROM: approx 0-75 deg    General Comments General comments (skin integrity, edema, etc.):   09/25/18 1115 09/25/18 1122 09/25/18 1131  Orthostatic Lying   BP- Lying  --   --  94/54  Pulse- Lying  --   --  76  Orthostatic Sitting  BP- Sitting 101/54  --   --   Pulse- Sitting 71  --   --   Orthostatic Standing at 0 minutes  BP- Standing at 0 minutes 96/53 92/47  --   Pulse- Standing at 0 minutes 77 65  --         Pertinent Vitals/Pain Pain Assessment: 0-10 Pain Score: 6  Pain Location: L knee with flexion Pain Descriptors / Indicators: Discomfort;Operative site  guarding Pain Intervention(s): Monitored during session    Home Living                      Prior Function            PT Goals (current goals can now be found in the care plan section) Acute Rehab PT Goals Patient Stated Goal: go home PT Goal Formulation: With patient Time For Goal Achievement: 10/08/18 Potential to Achieve Goals: Good Progress towards PT goals: Not progressing toward goals - comment(Limited by postural hypotension/decr standing tolerance)    Frequency    7X/week      PT Plan Current plan remains appropriate    Co-evaluation              AM-PAC PT "6 Clicks" Mobility   Outcome Measure  Help needed turning from your back to your side while in a flat bed without using bedrails?: None Help needed moving from lying on your  back to sitting on the side of a flat bed without using bedrails?: None Help needed moving to and from a bed to a chair (including a wheelchair)?: None Help needed standing up from a chair using your arms (e.g., wheelchair or bedside chair)?: A Little Help needed to walk in hospital room?: A Little Help needed climbing 3-5 steps with a railing? : A Lot 6 Click Score: 20    End of Session Equipment Utilized During Treatment: Gait belt Activity Tolerance: Other (comment)(limited by postural hypotension) Patient left: in bed;in CPM;with call bell/phone within reach Nurse Communication: Mobility status(decr standing tolerance; nausea) PT Visit Diagnosis: Muscle weakness (generalized) (M62.81);Difficulty in walking, not elsewhere classified (R26.2);Pain Pain - Right/Left: Left Pain - part of body: Knee     Time: 1100-1135 PT Time Calculation (min) (ACUTE ONLY): 35 min  Charges:  $Therapeutic Exercise: 8-22 mins $Therapeutic Activity: 8-22 mins                     Van Clines, PT  Acute Rehabilitation Services Pager [phone] Office [phone]    Jennifer Whitehead 09/25/2018, 1:16 PM

## 2018-09-25 NOTE — Progress Notes (Signed)
Physical Therapy Treatment Patient Details Name: Jennifer BubaMildred Whitehead MRN: [id number] DOB: 03-Apr-1951 Today's Date: 09/25/2018    History of Present Illness Patient is 68 y/o female s/p L TKA with hardware removal. PMH includes bilateral bunionectomy and L ACL repair.     PT Comments    Continuing work on functional mobility and activity tolerance; Notably better activity tolerance than am session, but still needs to be able to walk household distances without signs and symptoms of hypotension to go home; Worth planning on 2 PT sessions tomorrow before discharging home; See below for orthostatic BPs   Follow Up Recommendations  Follow surgeon's recommendation for DC plan and follow-up therapies     Equipment Recommendations  None recommended by PT    Recommendations for Other Services       Precautions / Restrictions Precautions Precautions: Knee Precaution Comments: Pt educated to not allow any pillow or bolster under knee for healing with optimal range of motion.  Restrictions LLE Weight Bearing: Weight bearing as tolerated    Mobility  Bed Mobility Overal bed mobility: Modified Independent             General bed mobility comments: Patient required increased time for bed mobility   Transfers Overall transfer level: Needs assistance Equipment used: Rolling walker (2 wheeled) Transfers: Sit to/from Stand Sit to Stand: Min guard         General transfer comment: Cues for hand placement and safety; good rise; cues also to self-monitor for activity tolerance  Ambulation/Gait Ambulation/Gait assistance: Min guard;+2 safety/equipment Gait Distance (Feet): 20 Feet Assistive device: Rolling walker (2 wheeled) Gait Pattern/deviations: Step-through pattern;Decreased step length - right;Decreased stance time - left;Decreased weight shift to left;Antalgic Gait velocity: decreased   General Gait Details: Cues to self-monitor for activity tolerance; Able to walk a modest  distance before symptoms of hypotension set in; nice, stable L knee in stance   Stairs             Wheelchair Mobility    Modified Rankin (Stroke Patients Only)       Balance     Sitting balance-Leahy Scale: Good       Standing balance-Leahy Scale: Fair                              Cognition Arousal/Alertness: Awake/alert Behavior During Therapy: WFL for tasks assessed/performed Overall Cognitive Status: Within Functional Limits for tasks assessed                                        Exercises Total Joint Exercises Ankle Circles/Pumps: AROM;Both;10 reps;Supine Quad Sets: AROM;Left;10 reps Short Arc Quad: AROM;Left;10 reps Heel Slides: AROM;AAROM;Left;10 reps Straight Leg Raises: AAROM;Left;10 reps Goniometric ROM: approx 0-75 deg    General Comments General comments (skin integrity, edema, etc.):   09/25/18 1504 09/25/18 1513  Vital Signs  Patient Position (if appropriate) Orthostatic Vitals  --   Orthostatic Lying   BP- Lying 118/61  --   Pulse- Lying 79  --   Orthostatic Sitting  BP- Sitting 120/63 (!) 91/36  Pulse- Sitting 84 69  Orthostatic Standing at 0 minutes  BP- Standing at 0 minutes 105/58  --   Pulse- Standing at 0 minutes 85  --   Orthostatic Standing at 3 minutes  BP- Standing at 3 minutes 97/56  --   Pulse- Standing at  3 minutes 86  --          Pertinent Vitals/Pain Pain Assessment: Faces Pain Score: 6  Faces Pain Scale: Hurts little more Pain Location: L knee with flexion Pain Descriptors / Indicators: Discomfort;Operative site guarding Pain Intervention(s): Monitored during session    Home Living                      Prior Function            PT Goals (current goals can now be found in the care plan section) Acute Rehab PT Goals Patient Stated Goal: go home PT Goal Formulation: With patient Time For Goal Achievement: 10/08/18 Potential to Achieve Goals: Good Progress towards PT  goals: Progressing toward goals    Frequency    7X/week      PT Plan Current plan remains appropriate    Co-evaluation              AM-PAC PT "6 Clicks" Mobility   Outcome Measure  Help needed turning from your back to your side while in a flat bed without using bedrails?: None Help needed moving from lying on your back to sitting on the side of a flat bed without using bedrails?: None Help needed moving to and from a bed to a chair (including a wheelchair)?: None Help needed standing up from a chair using your arms (e.g., wheelchair or bedside chair)?: A Little Help needed to walk in hospital room?: A Little Help needed climbing 3-5 steps with a railing? : A Lot 6 Click Score: 20    End of Session Equipment Utilized During Treatment: Gait belt Activity Tolerance: Patient tolerated treatment well(though still decr activity toelrance, but improved) Patient left: in chair;with call bell/phone within reach Nurse Communication: Mobility status PT Visit Diagnosis: Muscle weakness (generalized) (M62.81);Difficulty in walking, not elsewhere classified (R26.2);Pain Pain - Right/Left: Left Pain - part of body: Knee     Time: 7106-2694 PT Time Calculation (min) (ACUTE ONLY): 24 min  Charges:  $Therapeutic Exercise: 8-22 mins $Therapeutic Activity: 8-22 mins                     Van Clines, PT  Acute Rehabilitation Services Pager [phone] Office [phone]    Levi Aland 09/25/2018, 4:37 PM

## 2018-09-25 NOTE — Care Management Note (Signed)
Case Management Note  Patient Details  Name: Jennifer Whitehead MRN: [id number] Date of Birth: [birth year]  Subjective/Objective: 68 yr old female s/p left total knee arthroplasty.                    Action/Plan: Patient was preoperatively setup with [facility], no changes, Will have support at discharge. DME has been delivered to her home.    Expected Discharge Date:   09/26/18               Expected Discharge Plan:  Home w Home Health Services  In-House Referral:  NA  Discharge planning Services  CM Consult  Post Acute Care Choice:  Home Health, Durable Medical Equipment Choice offered to:  Patient  DME Arranged:  3-N-1, Walker rolling DME Agency:  [employer]  HH Arranged:  PT HH Agency:  [facility]  Status of Service:  Completed, signed off  If discussed at [employer] [facility] [employer], dates discussed:    Additional Comments:  Durenda Guthrie, RN 09/25/2018, 11:24 AM

## 2018-09-26 NOTE — Progress Notes (Signed)
Physical Therapy Treatment Patient Details Name: Jennifer Whitehead MRN: [id number] DOB: 1950-12-15 Today's Date: 09/26/2018    History of Present Illness Patient is 68 y/o female s/p L TKA with hardware removal. PMH includes bilateral bunionectomy and L ACL repair.     PT Comments    Patient is making good progress with PT.  From a mobility standpoint anticipate patient will be ready for DC home when medically ready.    Follow Up Recommendations  Follow surgeon's recommendation for DC plan and follow-up therapies     Equipment Recommendations  None recommended by PT    Recommendations for Other Services       Precautions / Restrictions Precautions Precautions: Knee Precaution Comments: precautions/positioning reviewed with pt Restrictions Weight Bearing Restrictions: Yes LLE Weight Bearing: Weight bearing as tolerated    Mobility  Bed Mobility Overal bed mobility: Modified Independent                Transfers Overall transfer level: Needs assistance Equipment used: Rolling walker (2 wheeled) Transfers: Sit to/from Stand Sit to Stand: Min guard         General transfer comment: min guard for safety  Ambulation/Gait Ambulation/Gait assistance: Supervision Gait Distance (Feet): 180 Feet Assistive device: Rolling walker (2 wheeled) Gait Pattern/deviations: Step-through pattern;Decreased step length - right;Decreased stance time - left;Decreased weight shift to left;Antalgic Gait velocity: decreased   General Gait Details: cues for L knee flexion during swing phase and for upright posture with less reliance on UE support   Stairs   Stairs assistance: Min assist Stair Management: No rails;Step to pattern;Backwards;With walker   General stair comments: carry over of sequencing and technique demonstrated   Wheelchair Mobility    Modified Rankin (Stroke Patients Only)       Balance     Sitting balance-Leahy Scale: Good       Standing  balance-Leahy Scale: Poor                              Cognition Arousal/Alertness: Awake/alert Behavior During Therapy: WFL for tasks assessed/performed Overall Cognitive Status: Within Functional Limits for tasks assessed                                        Exercises      General Comments        Pertinent Vitals/Pain Pain Assessment: 0-10 Pain Score: 8  Pain Location: L knee Pain Descriptors / Indicators: Guarding;Sore Pain Intervention(s): Limited activity within patient's tolerance;Monitored during session;Repositioned;Patient requesting pain meds-RN notified    Home Living                      Prior Function            PT Goals (current goals can now be found in the care plan section) Acute Rehab PT Goals Patient Stated Goal: go home Progress towards PT goals: Progressing toward goals    Frequency    7X/week      PT Plan Current plan remains appropriate    Co-evaluation              AM-PAC PT "6 Clicks" Mobility   Outcome Measure  Help needed turning from your back to your side while in a flat bed without using bedrails?: None Help needed moving from lying on your back to sitting on  the side of a flat bed without using bedrails?: None Help needed moving to and from a bed to a chair (including a wheelchair)?: None Help needed standing up from a chair using your arms (e.g., wheelchair or bedside chair)?: A Little Help needed to walk in hospital room?: A Little Help needed climbing 3-5 steps with a railing? : A Little 6 Click Score: 21    End of Session Equipment Utilized During Treatment: Gait belt Activity Tolerance: Patient tolerated treatment well Patient left: with call bell/phone within reach;in chair Nurse Communication: Mobility status PT Visit Diagnosis: Muscle weakness (generalized) (M62.81);Difficulty in walking, not elsewhere classified (R26.2);Pain Pain - Right/Left: Left Pain - part of body:  Knee     Time: 2831-5176 PT Time Calculation (min) (ACUTE ONLY): 28 min  Charges:  $Gait Training: 23-37 mins                     Erline Levine, PTA Acute Rehabilitation Services Pager: [phone] Office: [phone]     Carolynne Edouard 09/26/2018, 4:26 PM

## 2018-09-26 NOTE — Plan of Care (Signed)
  Problem: Activity: Goal: Risk for activity intolerance will decrease Outcome: Progressing   Problem: Pain Managment: Goal: General experience of comfort will improve Outcome: Progressing   Problem: Elimination: Goal: Will not experience complications related to bowel motility Outcome: Progressing Goal: Will not experience complications related to urinary retention Outcome: Progressing   Problem: Safety: Goal: Ability to remain free from injury will improve Outcome: Progressing

## 2018-09-26 NOTE — Progress Notes (Signed)
Discharge instructions completed with pt.  Pt verbalized understanding of the information.  Pt denies chest pain, shortness of breath, dizziness, lightheadedness, and n/v. Pt's IV discontinued. Pt waiting for husband so she can be transported home.

## 2018-09-26 NOTE — Progress Notes (Signed)
Subjective: Patient stable.  Pain well controlled.  Dressing changed today.  Patient is in the CPM machine.  Has not done stairs yet   Objective: Vital signs in last 24 hours: Temp:  [98.5 F (36.9 C)-99.1 F (37.3 C)] 98.6 F (37 C) (01/30 0545) Pulse Rate:  [85-89] 89 (01/30 0545) Resp:  [18] 18 (01/30 0545) BP: (117-134)/(54-68) 118/62 (01/30 0545) SpO2:  [98 %-100 %] 100 % (01/30 0545)  Intake/Output from previous day: 01/29 0701 - 01/30 0700 In: 240 [P.O.:240] Out: -  Intake/Output this shift: No intake/output data recorded.  Exam:  Dorsiflexion/Plantar flexion intact No cellulitis present Compartment soft  Labs: No results for input(s): HGB in the last 72 hours. No results for input(s): WBC, RBC, HCT, PLT in the last 72 hours. No results for input(s): NA, K, CL, CO2, BUN, CREATININE, GLUCOSE, CALCIUM in the last 72 hours. No results for input(s): LABPT, INR in the last 72 hours.  Assessment/Plan: Plan at this time is discharged to home after therapy this morning.  Anticipate discharge sometime after 12:00 noon.   G Scott Dean 09/26/2018, 8:21 AM

## 2018-09-26 NOTE — Progress Notes (Signed)
Physical Therapy Treatment Patient Details Name: Jennifer Whitehead MRN: [id number] DOB: 10-29-1950 Today's Date: 09/26/2018    History of Present Illness Patient is 68 y/o female s/p L TKA with hardware removal. PMH includes bilateral bunionectomy and L ACL repair.     PT Comments    Patient seen for mobility progression. Pt tolerated gait and stair training without c/o dizziness this session. Pt is able to ambulate 80 ft X 2 trials and ascend/descend steps simulating home entrance X 2 trials. Pt educated on importance of resting with L knee in extension for optimal ROM. Pt resting with legs down in recliner upon arrival. Continue to progress as tolerated.    Follow Up Recommendations  Follow surgeon's recommendation for DC plan and follow-up therapies     Equipment Recommendations  None recommended by PT    Recommendations for Other Services       Precautions / Restrictions Precautions Precautions: Knee Precaution Comments: precautions/positioning reviewed with pt Restrictions Weight Bearing Restrictions: Yes LLE Weight Bearing: Weight bearing as tolerated    Mobility  Bed Mobility               General bed mobility comments: pt OOB in chair upon arrival  Transfers Overall transfer level: Needs assistance Equipment used: Rolling walker (2 wheeled) Transfers: Sit to/from Stand Sit to Stand: Min guard         General transfer comment: min guard for safety  Ambulation/Gait Ambulation/Gait assistance: Min guard Gait Distance (Feet): (80 ft X 2 trials with seated break) Assistive device: Rolling walker (2 wheeled) Gait Pattern/deviations: Step-through pattern;Decreased step length - right;Decreased stance time - left;Decreased weight shift to left;Antalgic Gait velocity: decreased   General Gait Details: cues for L knee flexion during swing phase and L heel strike   Stairs Stairs: Yes Stairs assistance: Min assist Stair Management: No rails;Step to  pattern;Backwards;With walker Number of Stairs: (2 steps X 2 trials) General stair comments: cues for sequencing and technique; assist to stabilize RW; stair handout given   Wheelchair Mobility    Modified Rankin (Stroke Patients Only)       Balance     Sitting balance-Leahy Scale: Good       Standing balance-Leahy Scale: Poor                              Cognition Arousal/Alertness: Awake/alert Behavior During Therapy: WFL for tasks assessed/performed Overall Cognitive Status: Within Functional Limits for tasks assessed                                        Exercises      General Comments        Pertinent Vitals/Pain Pain Assessment: 0-10 Pain Score: 7  Pain Location: L knee Pain Descriptors / Indicators: Burning;Guarding;Sore Pain Intervention(s): Limited activity within patient's tolerance;Monitored during session;Repositioned;Patient requesting pain meds-RN notified;Premedicated before session;Ice applied    Home Living                      Prior Function            PT Goals (current goals can now be found in the care plan section) Acute Rehab PT Goals Patient Stated Goal: go home Progress towards PT goals: Progressing toward goals    Frequency    7X/week      PT Plan  Current plan remains appropriate    Co-evaluation              AM-PAC PT "6 Clicks" Mobility   Outcome Measure  Help needed turning from your back to your side while in a flat bed without using bedrails?: None Help needed moving from lying on your back to sitting on the side of a flat bed without using bedrails?: None Help needed moving to and from a bed to a chair (including a wheelchair)?: None Help needed standing up from a chair using your arms (e.g., wheelchair or bedside chair)?: A Little Help needed to walk in hospital room?: A Little Help needed climbing 3-5 steps with a railing? : A Little 6 Click Score: 21    End of  Session Equipment Utilized During Treatment: Gait belt Activity Tolerance: Patient tolerated treatment well Patient left: with call bell/phone within reach;in bed Nurse Communication: Mobility status PT Visit Diagnosis: Muscle weakness (generalized) (M62.81);Difficulty in walking, not elsewhere classified (R26.2);Pain Pain - Right/Left: Left Pain - part of body: Knee     Time: [id number] PT Time Calculation (min) (ACUTE ONLY): 36 min  Charges:  $Gait Training: 23-37 mins                     Erline LevineKellyn Daegen Berrocal, PTA Acute Rehabilitation Services Pager: [phone] Office: [phone]     Carolynne EdouardKellyn R Shemuel Harkleroad 09/26/2018, 11:37 AM

## 2018-09-27 ENCOUNTER — Telehealth (INDEPENDENT_AMBULATORY_CARE_PROVIDER_SITE_OTHER): Payer: Self-pay

## 2018-09-27 NOTE — Telephone Encounter (Signed)
Pt is s/p a left total knee called and  gave verbal ok for PT orders.

## 2018-09-27 NOTE — Telephone Encounter (Signed)
Calling for physical therapy orders 1 wk 1, 3 wk 1 and 1 wk 1

## 2018-09-30 ENCOUNTER — Telehealth (INDEPENDENT_AMBULATORY_CARE_PROVIDER_SITE_OTHER): Payer: Self-pay | Admitting: Orthopedic Surgery

## 2018-09-30 DIAGNOSIS — M1712 Unilateral primary osteoarthritis, left knee: Secondary | ICD-10-CM

## 2018-09-30 DIAGNOSIS — Z969 Presence of functional implant, unspecified: Secondary | ICD-10-CM

## 2018-09-30 DIAGNOSIS — Z96659 Presence of unspecified artificial knee joint: Secondary | ICD-10-CM

## 2018-09-30 NOTE — Telephone Encounter (Signed)
IC patient and advised she should continue the HHPT through her first PO visit and discuss OP PT at that time.

## 2018-09-30 NOTE — Telephone Encounter (Signed)
Patient called stating that she has not heard anything about her outpatient PT.  CB#[phone].  Thank you.

## 2018-10-04 NOTE — Discharge Summary (Signed)
Physician Discharge Summary  Patient ID: Randal BubaMildred Burston MRN: [id number] DOB/AGE: December 17, 1950 68 y.o.  Admit date: 09/24/2018 Discharge date: 09/26/2018  Admission Diagnoses:  Active Problems:   Arthritis of knee   Retained orthopedic hardware   Unilateral primary osteoarthritis, left knee   Discharge Diagnoses:  Same  Surgeries: Procedure(s): LEFT TOTAL KNEE ARTHROPLASTY Hardware Removal Left Knee on 09/24/2018   Consultants:   Discharged Condition: Stable  Hospital Course: Randal BubaMildred Sciascia is an 68 y.o. female who was admitted 09/24/2018 with a chief complaint of left knee pain, and found to have a diagnosis of left knee arthritis.  They were brought to the operating room on 09/24/2018 and underwent the above named procedures.  Patient tolerated procedure well without immediate complications.  She was started on pain medicine and aspirin for DVT prophylaxis.  She will follow-up with me in about 10 days for evaluation.  Home health physical therapy as well as CPM machine will be instituted.  Discharged home in good condition with pain controlled.  Antibiotics given:  Anti-infectives (From admission, onward)   Start     Dose/Rate Route Frequency Ordered Stop   09/24/18 1630  ceFAZolin (ANCEF) IVPB 2g/100 mL premix     2 g 200 mL/hr over 30 Minutes Intravenous Every 6 hours 09/24/18 1522 09/24/18 2201   09/24/18 0630  ceFAZolin (ANCEF) IVPB 2g/100 mL premix     2 g 200 mL/hr over 30 Minutes Intravenous To ShortStay Surgical 09/23/18 0841 09/24/18 0738    .  Recent vital signs:  Vitals:   09/26/18 0545 09/26/18 1020  BP: 118/62 129/64  Pulse: 89 91  Resp: 18 17  Temp: 98.6 F (37 C) 99.4 F (37.4 C)  SpO2: 100% 98%    Recent laboratory studies:  Results for orders placed or performed during the hospital encounter of 09/16/18  Urine culture  Result Value Ref Range   Specimen Description URINE, CLEAN CATCH    Special Requests      NONE Performed at [facility], [address] [place], [place] [address]    Culture <10,000 COLONIES/mL INSIGNIFICANT GROWTH (A)    Report Status 09/17/2018 FINAL   Surgical pcr screen  Result Value Ref Range   MRSA, PCR NEGATIVE NEGATIVE   Staphylococcus aureus NEGATIVE NEGATIVE  Basic metabolic panel  Result Value Ref Range   Sodium 140 135 - 145 mmol/L   Potassium 3.8 3.5 - 5.1 mmol/L   Chloride 104 98 - 111 mmol/L   CO2 26 22 - 32 mmol/L   Glucose, Bld 122 (H) 70 - 99 mg/dL   BUN 20 8 - 23 mg/dL   Creatinine, Ser 1.910.71 0.44 - 1.00 mg/dL   Calcium 9.4 8.9 - 47.810.3 mg/dL   GFR calc non Af Amer >60 >60 mL/min   GFR calc Af Amer >60 >60 mL/min   Anion gap 10 5 - 15  CBC  Result Value Ref Range   WBC 10.0 4.0 - 10.5 K/uL   RBC 4.24 3.87 - 5.11 MIL/uL   Hemoglobin 12.9 12.0 - 15.0 g/dL   HCT 29.540.2 62.136.0 - 30.846.0 %   MCV 94.8 80.0 - 100.0 fL   MCH 30.4 26.0 - 34.0 pg   MCHC 32.1 30.0 - 36.0 g/dL   RDW 65.713.8 84.611.5 - 96.215.5 %   Platelets 305 150 - 400 K/uL   nRBC 0.0 0.0 - 0.2 %  Urinalysis, Routine w reflex microscopic  Result Value Ref Range   Color, Urine STRAW (A) YELLOW  APPearance CLEAR CLEAR   Specific Gravity, Urine 1.005 1.005 - 1.030   pH 5.0 5.0 - 8.0   Glucose, UA NEGATIVE NEGATIVE mg/dL   Hgb urine dipstick NEGATIVE NEGATIVE   Bilirubin Urine NEGATIVE NEGATIVE   Ketones, ur NEGATIVE NEGATIVE mg/dL   Protein, ur NEGATIVE NEGATIVE mg/dL   Nitrite NEGATIVE NEGATIVE   Leukocytes, UA NEGATIVE NEGATIVE    Discharge Medications:   Allergies as of 09/26/2018      Reactions   Sulfa Antibiotics Hives      Medication List    STOP taking these medications   [name] 0.5-0.1 MG tablet Generic drug:  Estradiol-Norethindrone Acet   Fish Oil 1000 MG Caps   HYDROcodone-acetaminophen 5-325 MG tablet Commonly known as:  NORCO/VICODIN   Vitamin E 400 units Tabs     TAKE these medications   Biotin 1000 MCG tablet Take 1,000 mcg by mouth daily.   CALCIUM 1200+D3 PO Take 1 tablet by mouth daily.    Diclofenac Sodium 2 % Soln Commonly known as:  PENNSAID Place 2 Squirts onto the skin 2 (two) times daily as needed.   meloxicam 15 MG tablet Commonly known as:  MOBIC Take 7.5 mg by mouth daily.   multivitamin with minerals Tabs tablet Take 1 tablet by mouth daily.   vitamin C 500 MG tablet Commonly known as:  ASCORBIC ACID Take 500 mg by mouth daily.       Diagnostic Studies: No results found.  Disposition:   Discharge Instructions    Call MD / Call 911   Complete by:  As directed    If you experience chest pain or shortness of breath, CALL 911 and be transported to the hospital emergency room.  If you develope a fever above 101 F, pus (white drainage) or increased drainage or redness at the wound, or calf pain, call your surgeon's office.   Constipation Prevention   Complete by:  As directed    Drink plenty of fluids.  Prune juice may be helpful.  You may use a stool softener, such as Colace (over the counter) 100 mg twice a day.  Use MiraLax (over the counter) for constipation as needed.   Diet - low sodium heart healthy   Complete by:  As directed    Discharge instructions   Complete by:  As directed    Okay to be weightbearing as tolerated with walker You can discontinue use of the knee immobilizer once you can do 10 straight leg raises on her own CPM machine 1 hour 3 times a day Okay to shower dressing is waterproof Discontinue for 2 weeks the estrogen/progesterone tablet to diminish risk of blood clot formation   Increase activity slowly as tolerated   Complete by:  As directed       Follow-up Information    [facility] up.   Specialty:  Home Health Services Why:  A representative from [facility] will contact you to arrange start date and time for your therapy. Contact information: [address] [place] [address] [phone]            Signed: Burnard Bunting 10/04/2018, 9:33 AM

## 2018-10-09 ENCOUNTER — Ambulatory Visit (INDEPENDENT_AMBULATORY_CARE_PROVIDER_SITE_OTHER): Payer: BLUE CROSS/BLUE SHIELD | Admitting: Orthopedic Surgery

## 2018-10-09 ENCOUNTER — Ambulatory Visit (INDEPENDENT_AMBULATORY_CARE_PROVIDER_SITE_OTHER): Payer: BLUE CROSS/BLUE SHIELD

## 2018-10-09 DIAGNOSIS — M1712 Unilateral primary osteoarthritis, left knee: Secondary | ICD-10-CM

## 2018-10-09 MED ORDER — OXYCODONE-ACETAMINOPHEN 5-325 MG PO TABS: ORAL_TABLET | ORAL | 0 refills | Status: DC

## 2018-10-09 MED ORDER — METHOCARBAMOL 500 MG PO TABS: 500.0000 mg | ORAL_TABLET | Freq: Three times a day (TID) | ORAL | 0 refills | Status: DC | PRN

## 2018-10-12 ENCOUNTER — Encounter (INDEPENDENT_AMBULATORY_CARE_PROVIDER_SITE_OTHER): Payer: Self-pay | Admitting: Orthopedic Surgery

## 2018-10-12 NOTE — Progress Notes (Signed)
   Post-Op Visit Note   Patient: Jennifer Whitehead           Date of Birth: 03/03/1951           MRN: [id number] Visit Date: 10/09/2018 PCP: System, Pcp Not In   Assessment & Plan:  Chief Complaint:  Chief Complaint  Patient presents with  . Left Knee - Pain   Visit Diagnoses:  1. Unilateral primary osteoarthritis, left knee     Plan: Jennifer Whitehead is a patient with left total knee replacement done about 2 weeks ago.  She is doing well.  On exam she has good range of motion.  CPM is at 105.  Incision is intact.  Sutures removed today.  Refill oxycodone and refill Robaxin.  Start physical therapy.  Come back in 3 weeks for clinical recheck.  No calf tenderness today.  Follow-Up Instructions: Return in about 3 weeks (around 10/30/2018).   Orders:  Orders Placed This Encounter  Procedures  . XR KNEE 3 VIEW LEFT   Meds ordered this encounter  Medications  . oxyCODONE-acetaminophen (PERCOCET/ROXICET) 5-325 MG tablet    Sig: TAKE 1 TAB  PO Q8-12 HOURS PRN PAIN    Dispense:  45 tablet    Refill:  0  . methocarbamol (ROBAXIN) 500 MG tablet    Sig: Take 1 tablet (500 mg total) by mouth every 8 (eight) hours as needed for muscle spasms.    Dispense:  30 tablet    Refill:  0    Imaging: No results found.  PMFS History: Patient Active Problem List   Diagnosis Date Noted  . Retained orthopedic hardware   . Unilateral primary osteoarthritis, left knee   . Arthritis of knee 09/24/2018   Past Medical History:  Diagnosis Date  . Arthritis    in both knees    History reviewed. No pertinent family history.  Past Surgical History:  Procedure Laterality Date  . ANTERIOR CRUCIATE LIGAMENT REPAIR Left   . BUNIONECTOMY Bilateral   . dental bone grafts    . HARDWARE REMOVAL Left 09/24/2018   Procedure: Hardware Removal Left Knee;  Surgeon: Cammy Copa, MD;  Location: [facility] OR;  Service: Orthopedics;  Laterality: Left;  . RADIAL HEAD EXCISION Left   . TOTAL KNEE ARTHROPLASTY Left  09/24/2018   Procedure: LEFT TOTAL KNEE ARTHROPLASTY;  Surgeon: Cammy Copa, MD;  Location: [facility] OR;  Service: Orthopedics;  Laterality: Left;   Social History   Occupational History  . Not on file  Tobacco Use  . Smoking status: Former Smoker    Packs/day: 0.25    Years: 4.00    Pack years: 1.00    Types: Cigarettes    Last attempt to quit: 09/16/1976    Years since quitting: 42.0  . Smokeless tobacco: Never Used  Substance and Sexual Activity  . Alcohol use: Yes    Comment: several times a week  . Drug use: Never  . Sexual activity: Not on file

## 2018-10-15 DIAGNOSIS — M1712 Unilateral primary osteoarthritis, left knee: Secondary | ICD-10-CM | POA: Diagnosis not present

## 2018-10-15 DIAGNOSIS — M6281 Muscle weakness (generalized): Secondary | ICD-10-CM | POA: Diagnosis not present

## 2018-10-15 DIAGNOSIS — R262 Difficulty in walking, not elsewhere classified: Secondary | ICD-10-CM | POA: Diagnosis not present

## 2018-10-15 DIAGNOSIS — M25562 Pain in left knee: Secondary | ICD-10-CM | POA: Diagnosis not present

## 2018-10-16 ENCOUNTER — Ambulatory Visit (INDEPENDENT_AMBULATORY_CARE_PROVIDER_SITE_OTHER): Payer: BLUE CROSS/BLUE SHIELD | Admitting: Orthopedic Surgery

## 2018-10-17 DIAGNOSIS — M6281 Muscle weakness (generalized): Secondary | ICD-10-CM | POA: Diagnosis not present

## 2018-10-17 DIAGNOSIS — R262 Difficulty in walking, not elsewhere classified: Secondary | ICD-10-CM | POA: Diagnosis not present

## 2018-10-17 DIAGNOSIS — M1712 Unilateral primary osteoarthritis, left knee: Secondary | ICD-10-CM | POA: Diagnosis not present

## 2018-10-17 DIAGNOSIS — M25562 Pain in left knee: Secondary | ICD-10-CM | POA: Diagnosis not present

## 2018-10-22 DIAGNOSIS — M25562 Pain in left knee: Secondary | ICD-10-CM | POA: Diagnosis not present

## 2018-10-22 DIAGNOSIS — M1712 Unilateral primary osteoarthritis, left knee: Secondary | ICD-10-CM | POA: Diagnosis not present

## 2018-10-22 DIAGNOSIS — R262 Difficulty in walking, not elsewhere classified: Secondary | ICD-10-CM | POA: Diagnosis not present

## 2018-10-22 DIAGNOSIS — M6281 Muscle weakness (generalized): Secondary | ICD-10-CM | POA: Diagnosis not present

## 2018-10-24 DIAGNOSIS — M25562 Pain in left knee: Secondary | ICD-10-CM | POA: Diagnosis not present

## 2018-10-24 DIAGNOSIS — M6281 Muscle weakness (generalized): Secondary | ICD-10-CM | POA: Diagnosis not present

## 2018-10-24 DIAGNOSIS — R262 Difficulty in walking, not elsewhere classified: Secondary | ICD-10-CM | POA: Diagnosis not present

## 2018-10-24 DIAGNOSIS — M1712 Unilateral primary osteoarthritis, left knee: Secondary | ICD-10-CM | POA: Diagnosis not present

## 2018-10-29 DIAGNOSIS — M25562 Pain in left knee: Secondary | ICD-10-CM | POA: Diagnosis not present

## 2018-10-29 DIAGNOSIS — M6281 Muscle weakness (generalized): Secondary | ICD-10-CM | POA: Diagnosis not present

## 2018-10-29 DIAGNOSIS — M1712 Unilateral primary osteoarthritis, left knee: Secondary | ICD-10-CM | POA: Diagnosis not present

## 2018-10-29 DIAGNOSIS — R262 Difficulty in walking, not elsewhere classified: Secondary | ICD-10-CM | POA: Diagnosis not present

## 2018-10-30 ENCOUNTER — Ambulatory Visit (INDEPENDENT_AMBULATORY_CARE_PROVIDER_SITE_OTHER): Payer: BLUE CROSS/BLUE SHIELD | Admitting: Orthopedic Surgery

## 2018-10-30 ENCOUNTER — Encounter (INDEPENDENT_AMBULATORY_CARE_PROVIDER_SITE_OTHER): Payer: Self-pay | Admitting: Orthopedic Surgery

## 2018-10-30 DIAGNOSIS — M1712 Unilateral primary osteoarthritis, left knee: Secondary | ICD-10-CM

## 2018-10-30 MED ORDER — OXYCODONE-ACETAMINOPHEN 5-325 MG PO TABS: ORAL_TABLET | ORAL | 0 refills | Status: DC

## 2018-10-31 ENCOUNTER — Encounter (INDEPENDENT_AMBULATORY_CARE_PROVIDER_SITE_OTHER): Payer: Self-pay | Admitting: Orthopedic Surgery

## 2018-10-31 DIAGNOSIS — M1712 Unilateral primary osteoarthritis, left knee: Secondary | ICD-10-CM | POA: Diagnosis not present

## 2018-10-31 DIAGNOSIS — R262 Difficulty in walking, not elsewhere classified: Secondary | ICD-10-CM | POA: Diagnosis not present

## 2018-10-31 DIAGNOSIS — M25562 Pain in left knee: Secondary | ICD-10-CM | POA: Diagnosis not present

## 2018-10-31 DIAGNOSIS — M6281 Muscle weakness (generalized): Secondary | ICD-10-CM | POA: Diagnosis not present

## 2018-10-31 NOTE — Progress Notes (Signed)
   Post-Op Visit Note   Patient: Jennifer Whitehead           Date of Birth: [birth year]           MRN: [id number] Visit Date: 10/30/2018 PCP: System, Pcp Not In   Assessment & Plan:  Chief Complaint:  Chief Complaint  Patient presents with  . Left Knee - Follow-up   Visit Diagnoses:  1. Unilateral primary osteoarthritis, left knee     Plan: [name] is now about 5 weeks out left total knee replacement.  She has been doing well.  Taking 1 oxycodone a day and that is refilled.  In therapy 2 times a week.  On exam she has full extension to 102 flexion.  I want her getting in the pool yet because that inferior part of the incision has granulated but is not fully completely healed enough for submersion.  Come back in 5 weeks for clinical recheck.  She does have very good looking gait at this point for knee replacement.  Follow-Up Instructions: Return in about 5 weeks (around 12/04/2018).   Orders:  No orders of the defined types were placed in this encounter.  Meds ordered this encounter  Medications  . oxyCODONE-acetaminophen (PERCOCET/ROXICET) 5-325 MG tablet    Sig: TAKE 1 TAB  PO Q8-12 HOURS PRN PAIN    Dispense:  30 tablet    Refill:  0    Imaging: No results found.  PMFS History: Patient Active Problem List   Diagnosis Date Noted  . Retained orthopedic hardware   . Unilateral primary osteoarthritis, left knee   . Arthritis of knee 09/24/2018   Past Medical History:  Diagnosis Date  . Arthritis    in both knees    History reviewed. No pertinent family history.  Past Surgical History:  Procedure Laterality Date  . ANTERIOR CRUCIATE LIGAMENT REPAIR Left   . BUNIONECTOMY Bilateral   . dental bone grafts    . HARDWARE REMOVAL Left 09/24/2018   Procedure: Hardware Removal Left Knee;  Surgeon: Cammy Copa, MD;  Location: [facility] OR;  Service: Orthopedics;  Laterality: Left;  . RADIAL HEAD EXCISION Left   . TOTAL KNEE ARTHROPLASTY Left 09/24/2018   Procedure: LEFT TOTAL  KNEE ARTHROPLASTY;  Surgeon: Cammy Copa, MD;  Location: [facility] OR;  Service: Orthopedics;  Laterality: Left;   Social History   Occupational History  . Not on file  Tobacco Use  . Smoking status: Former Smoker    Packs/day: 0.25    Years: 4.00    Pack years: 1.00    Types: Cigarettes    Last attempt to quit: 09/16/1976    Years since quitting: 42.1  . Smokeless tobacco: Never Used  Substance and Sexual Activity  . Alcohol use: Yes    Comment: several times a week  . Drug use: Never  . Sexual activity: Not on file

## 2018-11-05 DIAGNOSIS — M6281 Muscle weakness (generalized): Secondary | ICD-10-CM | POA: Diagnosis not present

## 2018-11-05 DIAGNOSIS — R262 Difficulty in walking, not elsewhere classified: Secondary | ICD-10-CM | POA: Diagnosis not present

## 2018-11-05 DIAGNOSIS — M25562 Pain in left knee: Secondary | ICD-10-CM | POA: Diagnosis not present

## 2018-11-05 DIAGNOSIS — M1712 Unilateral primary osteoarthritis, left knee: Secondary | ICD-10-CM | POA: Diagnosis not present

## 2018-11-12 DIAGNOSIS — R262 Difficulty in walking, not elsewhere classified: Secondary | ICD-10-CM | POA: Diagnosis not present

## 2018-11-12 DIAGNOSIS — M6281 Muscle weakness (generalized): Secondary | ICD-10-CM | POA: Diagnosis not present

## 2018-11-12 DIAGNOSIS — M1712 Unilateral primary osteoarthritis, left knee: Secondary | ICD-10-CM | POA: Diagnosis not present

## 2018-11-12 DIAGNOSIS — M25562 Pain in left knee: Secondary | ICD-10-CM | POA: Diagnosis not present

## 2018-11-14 DIAGNOSIS — R262 Difficulty in walking, not elsewhere classified: Secondary | ICD-10-CM | POA: Diagnosis not present

## 2018-11-14 DIAGNOSIS — M1712 Unilateral primary osteoarthritis, left knee: Secondary | ICD-10-CM | POA: Diagnosis not present

## 2018-11-14 DIAGNOSIS — M25562 Pain in left knee: Secondary | ICD-10-CM | POA: Diagnosis not present

## 2018-11-14 DIAGNOSIS — M6281 Muscle weakness (generalized): Secondary | ICD-10-CM | POA: Diagnosis not present

## 2018-11-18 DIAGNOSIS — M1712 Unilateral primary osteoarthritis, left knee: Secondary | ICD-10-CM | POA: Diagnosis not present

## 2018-11-18 DIAGNOSIS — M6281 Muscle weakness (generalized): Secondary | ICD-10-CM | POA: Diagnosis not present

## 2018-11-18 DIAGNOSIS — R262 Difficulty in walking, not elsewhere classified: Secondary | ICD-10-CM | POA: Diagnosis not present

## 2018-11-18 DIAGNOSIS — M25562 Pain in left knee: Secondary | ICD-10-CM | POA: Diagnosis not present

## 2018-11-25 DIAGNOSIS — M6281 Muscle weakness (generalized): Secondary | ICD-10-CM | POA: Diagnosis not present

## 2018-11-25 DIAGNOSIS — M1712 Unilateral primary osteoarthritis, left knee: Secondary | ICD-10-CM | POA: Diagnosis not present

## 2018-11-25 DIAGNOSIS — R262 Difficulty in walking, not elsewhere classified: Secondary | ICD-10-CM | POA: Diagnosis not present

## 2018-11-25 DIAGNOSIS — M25562 Pain in left knee: Secondary | ICD-10-CM | POA: Diagnosis not present

## 2018-12-03 ENCOUNTER — Telehealth (INDEPENDENT_AMBULATORY_CARE_PROVIDER_SITE_OTHER): Payer: Self-pay | Admitting: *Deleted

## 2018-12-03 ENCOUNTER — Telehealth (INDEPENDENT_AMBULATORY_CARE_PROVIDER_SITE_OTHER): Payer: Self-pay

## 2018-12-03 NOTE — Telephone Encounter (Signed)
Pt called back and answered no to all COVID-19 screening questions 

## 2018-12-03 NOTE — Telephone Encounter (Signed)
Called pt and lvm asking pt to return call to answer covid-19 pre screening questions.    

## 2018-12-04 ENCOUNTER — Other Ambulatory Visit: Payer: Self-pay

## 2018-12-04 ENCOUNTER — Encounter (INDEPENDENT_AMBULATORY_CARE_PROVIDER_SITE_OTHER): Payer: Self-pay | Admitting: Orthopedic Surgery

## 2018-12-04 ENCOUNTER — Ambulatory Visit (INDEPENDENT_AMBULATORY_CARE_PROVIDER_SITE_OTHER): Payer: BLUE CROSS/BLUE SHIELD | Admitting: Orthopedic Surgery

## 2018-12-04 DIAGNOSIS — M1712 Unilateral primary osteoarthritis, left knee: Secondary | ICD-10-CM

## 2018-12-04 NOTE — Progress Notes (Signed)
   Post-Op Visit Note   Patient: Jennifer Whitehead           Date of Birth: 11-Sep-1950           MRN: [id number] Visit Date: 12/04/2018 PCP: System, Pcp Not In   Assessment & Plan:  Chief Complaint:  Chief Complaint  Patient presents with  . Left Knee - Follow-up   Visit Diagnoses:  1. Unilateral primary osteoarthritis, left knee     Plan: Jennifer Whitehead is a 68 year old patient is now about 2-1/2 months out left total knee replacement.  She has been doing well.  Therapy note is reviewed.  She is at 0-1 20.  She has no effusion on exam the incision looks good.  I will release her at this time.  Home exercise program.  Continue to work on strengthening.  She is doing very well with a knee replacement.  Need to have dental prophylaxis before procedures for the next 2 years.  Asked her to call me to get that sent in.  Follow-Up Instructions: Return if symptoms worsen or fail to improve.   Orders:  No orders of the defined types were placed in this encounter.  No orders of the defined types were placed in this encounter.   Imaging: No results found.  PMFS History: Patient Active Problem List   Diagnosis Date Noted  . Retained orthopedic hardware   . Unilateral primary osteoarthritis, left knee   . Arthritis of knee 09/24/2018   Past Medical History:  Diagnosis Date  . Arthritis    in both knees    History reviewed. No pertinent family history.  Past Surgical History:  Procedure Laterality Date  . ANTERIOR CRUCIATE LIGAMENT REPAIR Left   . BUNIONECTOMY Bilateral   . dental bone grafts    . HARDWARE REMOVAL Left 09/24/2018   Procedure: Hardware Removal Left Knee;  Surgeon: Cammy Copa, MD;  Location: [facility] OR;  Service: Orthopedics;  Laterality: Left;  . RADIAL HEAD EXCISION Left   . TOTAL KNEE ARTHROPLASTY Left 09/24/2018   Procedure: LEFT TOTAL KNEE ARTHROPLASTY;  Surgeon: Cammy Copa, MD;  Location: [facility] OR;  Service: Orthopedics;  Laterality: Left;   Social  History   Occupational History  . Not on file  Tobacco Use  . Smoking status: Former Smoker    Packs/day: 0.25    Years: 4.00    Pack years: 1.00    Types: Cigarettes    Last attempt to quit: 09/16/1976    Years since quitting: 42.2  . Smokeless tobacco: Never Used  Substance and Sexual Activity  . Alcohol use: Yes    Comment: several times a week  . Drug use: Never  . Sexual activity: Not on file

## 2018-12-09 DIAGNOSIS — M1712 Unilateral primary osteoarthritis, left knee: Secondary | ICD-10-CM | POA: Diagnosis not present

## 2018-12-09 DIAGNOSIS — M6281 Muscle weakness (generalized): Secondary | ICD-10-CM | POA: Diagnosis not present

## 2018-12-09 DIAGNOSIS — R262 Difficulty in walking, not elsewhere classified: Secondary | ICD-10-CM | POA: Diagnosis not present

## 2018-12-09 DIAGNOSIS — M25562 Pain in left knee: Secondary | ICD-10-CM | POA: Diagnosis not present

## 2018-12-11 DIAGNOSIS — M1712 Unilateral primary osteoarthritis, left knee: Secondary | ICD-10-CM | POA: Diagnosis not present

## 2018-12-11 DIAGNOSIS — R262 Difficulty in walking, not elsewhere classified: Secondary | ICD-10-CM | POA: Diagnosis not present

## 2018-12-11 DIAGNOSIS — M6281 Muscle weakness (generalized): Secondary | ICD-10-CM | POA: Diagnosis not present

## 2018-12-11 DIAGNOSIS — M25562 Pain in left knee: Secondary | ICD-10-CM | POA: Diagnosis not present

## 2018-12-14 ENCOUNTER — Other Ambulatory Visit (INDEPENDENT_AMBULATORY_CARE_PROVIDER_SITE_OTHER): Payer: Self-pay | Admitting: Orthopedic Surgery

## 2018-12-16 DIAGNOSIS — R262 Difficulty in walking, not elsewhere classified: Secondary | ICD-10-CM | POA: Diagnosis not present

## 2018-12-16 DIAGNOSIS — M1712 Unilateral primary osteoarthritis, left knee: Secondary | ICD-10-CM | POA: Diagnosis not present

## 2018-12-16 DIAGNOSIS — M25562 Pain in left knee: Secondary | ICD-10-CM | POA: Diagnosis not present

## 2018-12-16 DIAGNOSIS — M6281 Muscle weakness (generalized): Secondary | ICD-10-CM | POA: Diagnosis not present

## 2018-12-16 NOTE — Telephone Encounter (Signed)
No need to rf

## 2018-12-16 NOTE — Telephone Encounter (Signed)
Need to rf? 

## 2018-12-18 DIAGNOSIS — M25562 Pain in left knee: Secondary | ICD-10-CM | POA: Diagnosis not present

## 2018-12-18 DIAGNOSIS — R262 Difficulty in walking, not elsewhere classified: Secondary | ICD-10-CM | POA: Diagnosis not present

## 2018-12-18 DIAGNOSIS — M1712 Unilateral primary osteoarthritis, left knee: Secondary | ICD-10-CM | POA: Diagnosis not present

## 2018-12-18 DIAGNOSIS — M6281 Muscle weakness (generalized): Secondary | ICD-10-CM | POA: Diagnosis not present

## 2018-12-23 DIAGNOSIS — R262 Difficulty in walking, not elsewhere classified: Secondary | ICD-10-CM | POA: Diagnosis not present

## 2018-12-23 DIAGNOSIS — M25562 Pain in left knee: Secondary | ICD-10-CM | POA: Diagnosis not present

## 2018-12-23 DIAGNOSIS — M6281 Muscle weakness (generalized): Secondary | ICD-10-CM | POA: Diagnosis not present

## 2018-12-23 DIAGNOSIS — M1712 Unilateral primary osteoarthritis, left knee: Secondary | ICD-10-CM | POA: Diagnosis not present

## 2018-12-25 DIAGNOSIS — M1712 Unilateral primary osteoarthritis, left knee: Secondary | ICD-10-CM | POA: Diagnosis not present

## 2018-12-25 DIAGNOSIS — M6281 Muscle weakness (generalized): Secondary | ICD-10-CM | POA: Diagnosis not present

## 2018-12-25 DIAGNOSIS — M25562 Pain in left knee: Secondary | ICD-10-CM | POA: Diagnosis not present

## 2018-12-25 DIAGNOSIS — R262 Difficulty in walking, not elsewhere classified: Secondary | ICD-10-CM | POA: Diagnosis not present

## 2018-12-30 DIAGNOSIS — R262 Difficulty in walking, not elsewhere classified: Secondary | ICD-10-CM | POA: Diagnosis not present

## 2018-12-30 DIAGNOSIS — M25562 Pain in left knee: Secondary | ICD-10-CM | POA: Diagnosis not present

## 2018-12-30 DIAGNOSIS — M1712 Unilateral primary osteoarthritis, left knee: Secondary | ICD-10-CM | POA: Diagnosis not present

## 2018-12-30 DIAGNOSIS — M6281 Muscle weakness (generalized): Secondary | ICD-10-CM | POA: Diagnosis not present

## 2019-01-01 DIAGNOSIS — M25562 Pain in left knee: Secondary | ICD-10-CM | POA: Diagnosis not present

## 2019-01-01 DIAGNOSIS — R262 Difficulty in walking, not elsewhere classified: Secondary | ICD-10-CM | POA: Diagnosis not present

## 2019-01-01 DIAGNOSIS — M1712 Unilateral primary osteoarthritis, left knee: Secondary | ICD-10-CM | POA: Diagnosis not present

## 2019-01-01 DIAGNOSIS — M6281 Muscle weakness (generalized): Secondary | ICD-10-CM | POA: Diagnosis not present

## 2019-01-06 DIAGNOSIS — M6281 Muscle weakness (generalized): Secondary | ICD-10-CM | POA: Diagnosis not present

## 2019-01-06 DIAGNOSIS — M1712 Unilateral primary osteoarthritis, left knee: Secondary | ICD-10-CM | POA: Diagnosis not present

## 2019-01-06 DIAGNOSIS — R262 Difficulty in walking, not elsewhere classified: Secondary | ICD-10-CM | POA: Diagnosis not present

## 2019-01-06 DIAGNOSIS — M25562 Pain in left knee: Secondary | ICD-10-CM | POA: Diagnosis not present

## 2019-01-07 ENCOUNTER — Telehealth: Payer: Self-pay | Admitting: Orthopedic Surgery

## 2019-01-07 MED ORDER — AMOXICILLIN 500 MG PO TABS: ORAL_TABLET | ORAL | 0 refills | Status: DC

## 2019-01-07 NOTE — Telephone Encounter (Signed)
Y amox 2 gr 1 hr before  needs to be 3 mos post op

## 2019-01-07 NOTE — Telephone Encounter (Signed)
09/24/18 Left TKA Please advise. Thanks.

## 2019-01-07 NOTE — Telephone Encounter (Signed)
Sent in for patient.  She wants to know if she will need abx if she gets a tattoo or body piercing?

## 2019-01-07 NOTE — Telephone Encounter (Signed)
Patient called having teeth cleaned on 01/21/19. Wants to know if she will need an antibiotic.  Please call to advise [phone]

## 2019-01-08 DIAGNOSIS — M6281 Muscle weakness (generalized): Secondary | ICD-10-CM | POA: Diagnosis not present

## 2019-01-08 DIAGNOSIS — M25562 Pain in left knee: Secondary | ICD-10-CM | POA: Diagnosis not present

## 2019-01-08 DIAGNOSIS — R262 Difficulty in walking, not elsewhere classified: Secondary | ICD-10-CM | POA: Diagnosis not present

## 2019-01-08 DIAGNOSIS — M1712 Unilateral primary osteoarthritis, left knee: Secondary | ICD-10-CM | POA: Diagnosis not present

## 2019-01-08 NOTE — Telephone Encounter (Signed)
Tried calling patient. No answer. Can discuss with patient if calls back.

## 2019-01-08 NOTE — Telephone Encounter (Signed)
No tats distal to tka - body piercing besides ear should get abx beforehand

## 2019-01-13 DIAGNOSIS — M1712 Unilateral primary osteoarthritis, left knee: Secondary | ICD-10-CM | POA: Diagnosis not present

## 2019-01-13 DIAGNOSIS — M6281 Muscle weakness (generalized): Secondary | ICD-10-CM | POA: Diagnosis not present

## 2019-01-13 DIAGNOSIS — R262 Difficulty in walking, not elsewhere classified: Secondary | ICD-10-CM | POA: Diagnosis not present

## 2019-01-13 DIAGNOSIS — M25562 Pain in left knee: Secondary | ICD-10-CM | POA: Diagnosis not present

## 2019-01-15 DIAGNOSIS — M25562 Pain in left knee: Secondary | ICD-10-CM | POA: Diagnosis not present

## 2019-01-15 DIAGNOSIS — M6281 Muscle weakness (generalized): Secondary | ICD-10-CM | POA: Diagnosis not present

## 2019-01-15 DIAGNOSIS — M1712 Unilateral primary osteoarthritis, left knee: Secondary | ICD-10-CM | POA: Diagnosis not present

## 2019-01-15 DIAGNOSIS — R262 Difficulty in walking, not elsewhere classified: Secondary | ICD-10-CM | POA: Diagnosis not present

## 2019-01-22 DIAGNOSIS — R262 Difficulty in walking, not elsewhere classified: Secondary | ICD-10-CM | POA: Diagnosis not present

## 2019-01-22 DIAGNOSIS — M1712 Unilateral primary osteoarthritis, left knee: Secondary | ICD-10-CM | POA: Diagnosis not present

## 2019-01-22 DIAGNOSIS — M25562 Pain in left knee: Secondary | ICD-10-CM | POA: Diagnosis not present

## 2019-01-22 DIAGNOSIS — M6281 Muscle weakness (generalized): Secondary | ICD-10-CM | POA: Diagnosis not present

## 2019-01-24 DIAGNOSIS — M1712 Unilateral primary osteoarthritis, left knee: Secondary | ICD-10-CM | POA: Diagnosis not present

## 2019-01-24 DIAGNOSIS — M25562 Pain in left knee: Secondary | ICD-10-CM | POA: Diagnosis not present

## 2019-01-24 DIAGNOSIS — M6281 Muscle weakness (generalized): Secondary | ICD-10-CM | POA: Diagnosis not present

## 2019-01-24 DIAGNOSIS — R262 Difficulty in walking, not elsewhere classified: Secondary | ICD-10-CM | POA: Diagnosis not present

## 2019-01-27 DIAGNOSIS — R262 Difficulty in walking, not elsewhere classified: Secondary | ICD-10-CM | POA: Diagnosis not present

## 2019-01-27 DIAGNOSIS — M25562 Pain in left knee: Secondary | ICD-10-CM | POA: Diagnosis not present

## 2019-01-27 DIAGNOSIS — M1712 Unilateral primary osteoarthritis, left knee: Secondary | ICD-10-CM | POA: Diagnosis not present

## 2019-01-27 DIAGNOSIS — M6281 Muscle weakness (generalized): Secondary | ICD-10-CM | POA: Diagnosis not present

## 2019-01-29 DIAGNOSIS — M25562 Pain in left knee: Secondary | ICD-10-CM | POA: Diagnosis not present

## 2019-01-29 DIAGNOSIS — R262 Difficulty in walking, not elsewhere classified: Secondary | ICD-10-CM | POA: Diagnosis not present

## 2019-01-29 DIAGNOSIS — M6281 Muscle weakness (generalized): Secondary | ICD-10-CM | POA: Diagnosis not present

## 2019-01-29 DIAGNOSIS — M1712 Unilateral primary osteoarthritis, left knee: Secondary | ICD-10-CM | POA: Diagnosis not present

## 2019-02-05 DIAGNOSIS — M6281 Muscle weakness (generalized): Secondary | ICD-10-CM | POA: Diagnosis not present

## 2019-02-05 DIAGNOSIS — R262 Difficulty in walking, not elsewhere classified: Secondary | ICD-10-CM | POA: Diagnosis not present

## 2019-02-05 DIAGNOSIS — M25562 Pain in left knee: Secondary | ICD-10-CM | POA: Diagnosis not present

## 2019-02-05 DIAGNOSIS — M1712 Unilateral primary osteoarthritis, left knee: Secondary | ICD-10-CM | POA: Diagnosis not present

## 2019-02-07 DIAGNOSIS — M6281 Muscle weakness (generalized): Secondary | ICD-10-CM | POA: Diagnosis not present

## 2019-02-07 DIAGNOSIS — M1712 Unilateral primary osteoarthritis, left knee: Secondary | ICD-10-CM | POA: Diagnosis not present

## 2019-02-07 DIAGNOSIS — M25562 Pain in left knee: Secondary | ICD-10-CM | POA: Diagnosis not present

## 2019-02-07 DIAGNOSIS — R262 Difficulty in walking, not elsewhere classified: Secondary | ICD-10-CM | POA: Diagnosis not present

## 2019-02-10 DIAGNOSIS — M6281 Muscle weakness (generalized): Secondary | ICD-10-CM | POA: Diagnosis not present

## 2019-02-10 DIAGNOSIS — R262 Difficulty in walking, not elsewhere classified: Secondary | ICD-10-CM | POA: Diagnosis not present

## 2019-02-10 DIAGNOSIS — M25562 Pain in left knee: Secondary | ICD-10-CM | POA: Diagnosis not present

## 2019-02-10 DIAGNOSIS — M1712 Unilateral primary osteoarthritis, left knee: Secondary | ICD-10-CM | POA: Diagnosis not present

## 2019-02-12 DIAGNOSIS — M6281 Muscle weakness (generalized): Secondary | ICD-10-CM | POA: Diagnosis not present

## 2019-02-12 DIAGNOSIS — M1712 Unilateral primary osteoarthritis, left knee: Secondary | ICD-10-CM | POA: Diagnosis not present

## 2019-02-12 DIAGNOSIS — R262 Difficulty in walking, not elsewhere classified: Secondary | ICD-10-CM | POA: Diagnosis not present

## 2019-02-12 DIAGNOSIS — M25562 Pain in left knee: Secondary | ICD-10-CM | POA: Diagnosis not present

## 2019-05-13 ENCOUNTER — Other Ambulatory Visit: Payer: Self-pay

## 2019-05-13 ENCOUNTER — Encounter: Payer: Self-pay | Admitting: Family Medicine

## 2019-05-13 ENCOUNTER — Ambulatory Visit: Payer: BC Managed Care – PPO | Admitting: Family Medicine

## 2019-05-13 VITALS — BP 120/82 | HR 84 | Temp 97.8°F | Ht 63.0 in | Wt 180.8 lb

## 2019-05-13 DIAGNOSIS — Z23 Encounter for immunization: Secondary | ICD-10-CM

## 2019-05-13 DIAGNOSIS — Z1211 Encounter for screening for malignant neoplasm of colon: Secondary | ICD-10-CM

## 2019-05-13 NOTE — Progress Notes (Signed)
   Subjective:    Patient ID: Jennifer Whitehead, female    DOB: [birth year], 68 y.o.   MRN: [id number]  HPI She is here for a get acquainted visit.  She has enjoyed excellent health and is on no prescription medications.  She did have a recent TKR and seems to be doing quite nicely with that.  Review of the record indicates she does need an update on her immunizations.   Review of Systems     Objective:   Physical Exam  Alert and in no distress otherwise not examined.  Her medical record was updated.      Assessment & Plan:  Need for Tdap vaccination - Plan: Tdap vaccine greater than or equal to 7yo IM  Need for shingles vaccine - Plan: Varicella-zoster vaccine IM  Need for influenza vaccination - Plan: Flu Vaccine QUAD High Dose(Fluad)  Screening for colon cancer - Plan: Cologuard Recommend she come here next year for a complete examination.  She was comfortable with that.

## 2019-05-21 LAB — COLOGUARD: Cologuard: NEGATIVE

## 2019-05-27 NOTE — Progress Notes (Signed)
LVM for pt concerning cologuard. Kh

## 2019-07-23 DIAGNOSIS — Z01419 Encounter for gynecological examination (general) (routine) without abnormal findings: Secondary | ICD-10-CM | POA: Diagnosis not present

## 2019-08-05 ENCOUNTER — Encounter: Payer: Self-pay | Admitting: Family Medicine

## 2019-08-05 ENCOUNTER — Ambulatory Visit: Payer: BC Managed Care – PPO | Admitting: Family Medicine

## 2019-08-05 ENCOUNTER — Other Ambulatory Visit: Payer: Self-pay

## 2019-08-05 VITALS — BP 118/78 | HR 78 | Temp 98.6°F | Ht 63.5 in | Wt 178.4 lb

## 2019-08-05 DIAGNOSIS — Z0000 Encounter for general adult medical examination without abnormal findings: Secondary | ICD-10-CM | POA: Diagnosis not present

## 2019-08-05 DIAGNOSIS — Z9189 Other specified personal risk factors, not elsewhere classified: Secondary | ICD-10-CM | POA: Diagnosis not present

## 2019-08-05 DIAGNOSIS — Z23 Encounter for immunization: Secondary | ICD-10-CM | POA: Diagnosis not present

## 2019-08-05 DIAGNOSIS — Z96652 Presence of left artificial knee joint: Secondary | ICD-10-CM | POA: Diagnosis not present

## 2019-08-05 DIAGNOSIS — Z13220 Encounter for screening for lipoid disorders: Secondary | ICD-10-CM

## 2019-08-05 DIAGNOSIS — Z79899 Other long term (current) drug therapy: Secondary | ICD-10-CM

## 2019-08-05 NOTE — Progress Notes (Signed)
   Subjective:    Patient ID: Jennifer Whitehead, female    DOB: 08/11/1951, 68 y.o.   MRN: [id number]  HPI She is here for complete examination.  She does have arthritis of her back and knees.  She has been using Mobic pretty much on a regular basis.  She also continues on estrogen that her gynecologist gives her and plans to continue on that.  She gets regular mammograms.  She is on multiple over-the-counter medications and is having no difficulty with that.  She has had DEXA scans as well as hepatitis C done elsewhere and this was been negative.  She has started exercising again.  Her marriage and home life are going well.  Family and social history as well as health maintenance and immunizations was reviewed.   Review of Systems  All other systems reviewed and are negative.      Objective:   Physical Exam Alert and in no distress. Tympanic membranes and canals are normal. Pharyngeal area is normal. Neck is supple without adenopathy or thyromegaly. Cardiac exam shows a regular sinus rhythm without murmurs or gallops. Lungs are clear to auscultation.  Abdominal exam shows active bowel sounds without masses or tenderness.       Assessment & Plan:  Routine general medical examination at a health care facility - Plan: CBC with Differential, Comprehensive metabolic panel, Lipid panel, Vitamin D 25 hydroxy  Need for shingles vaccine - Plan: Varicella-zoster vaccine IM (Shingrix)  At high risk for breast cancer  S/P total knee arthroplasty, left Discussed using Tylenol for pain relief first before going to Mobic and discussed the benefits of that in terms of reducing fluid retention as well as renal issues.  Also discussed hormone replacement.  She plans to continue with that through her gynecologist.  Recommend every other year mammogram since she has had normal mammograms.  Continue to exercise.

## 2019-08-06 LAB — LIPID PANEL
Chol/HDL Ratio: 2.6 ratio (ref 0.0–4.4)
Cholesterol, Total: 218 mg/dL — ABNORMAL HIGH (ref 100–199)
HDL: 84 mg/dL (ref >39)
LDL Chol Calc (NIH): 121 mg/dL — ABNORMAL HIGH (ref 0–99)
Triglycerides: 73 mg/dL (ref 0–149)
VLDL Cholesterol Cal: 13 mg/dL (ref 5–40)

## 2019-08-06 LAB — CBC WITH DIFFERENTIAL/PLATELET
Basophils Absolute: 0.1 10*3/uL (ref 0.0–0.2)
Basos: 1 %
EOS (ABSOLUTE): 0.1 10*3/uL (ref 0.0–0.4)
Eos: 2 %
Hematocrit: 42.2 % (ref 34.0–46.6)
Hemoglobin: 13.6 g/dL (ref 11.1–15.9)
Immature Grans (Abs): 0 10*3/uL (ref 0.0–0.1)
Immature Granulocytes: 0 %
Lymphocytes Absolute: 1.7 10*3/uL (ref 0.7–3.1)
Lymphs: 26 %
MCH: 30.4 pg (ref 26.6–33.0)
MCHC: 32.2 g/dL (ref 31.5–35.7)
MCV: 94 fL (ref 79–97)
Monocytes Absolute: 0.6 10*3/uL (ref 0.1–0.9)
Monocytes: 9 %
Neutrophils Absolute: 4.1 10*3/uL (ref 1.4–7.0)
Neutrophils: 62 %
Platelets: 316 10*3/uL (ref 150–450)
RBC: 4.48 x10E6/uL (ref 3.77–5.28)
RDW: 12.7 % (ref 11.7–15.4)
WBC: 6.6 10*3/uL (ref 3.4–10.8)

## 2019-08-06 LAB — COMPREHENSIVE METABOLIC PANEL
ALT: 21 IU/L (ref 0–32)
AST: 16 IU/L (ref 0–40)
Albumin/Globulin Ratio: 1.8 (ref 1.2–2.2)
Albumin: 4.5 g/dL (ref 3.8–4.8)
Alkaline Phosphatase: 55 IU/L (ref 39–117)
BUN/Creatinine Ratio: 27 (ref 12–28)
BUN: 19 mg/dL (ref 8–27)
Bilirubin Total: 0.3 mg/dL (ref 0.0–1.2)
CO2: 23 mmol/L (ref 20–29)
Calcium: 9.8 mg/dL (ref 8.7–10.3)
Chloride: 103 mmol/L (ref 96–106)
Creatinine, Ser: 0.71 mg/dL (ref 0.57–1.00)
GFR calc Af Amer: 100 mL/min/{1.73_m2} (ref >59)
GFR calc non Af Amer: 87 mL/min/{1.73_m2} (ref >59)
Globulin, Total: 2.5 g/dL (ref 1.5–4.5)
Glucose: 95 mg/dL (ref 65–99)
Potassium: 5.6 mmol/L — ABNORMAL HIGH (ref 3.5–5.2)
Sodium: 141 mmol/L (ref 134–144)
Total Protein: 7 g/dL (ref 6.0–8.5)

## 2019-08-06 LAB — VITAMIN D 25 HYDROXY (VIT D DEFICIENCY, FRACTURES): Vit D, 25-Hydroxy: 34.7 ng/mL (ref 30.0–100.0)

## 2019-08-15 DIAGNOSIS — Z1231 Encounter for screening mammogram for malignant neoplasm of breast: Secondary | ICD-10-CM | POA: Diagnosis not present

## 2019-09-24 ENCOUNTER — Other Ambulatory Visit: Payer: Self-pay

## 2019-09-24 ENCOUNTER — Ambulatory Visit: Payer: 59 | Admitting: Orthopedic Surgery

## 2019-09-24 ENCOUNTER — Encounter: Payer: Self-pay | Admitting: Orthopedic Surgery

## 2019-09-24 ENCOUNTER — Ambulatory Visit (INDEPENDENT_AMBULATORY_CARE_PROVIDER_SITE_OTHER): Payer: 59

## 2019-09-24 DIAGNOSIS — M25551 Pain in right hip: Secondary | ICD-10-CM

## 2019-09-24 DIAGNOSIS — M7061 Trochanteric bursitis, right hip: Secondary | ICD-10-CM

## 2019-09-28 ENCOUNTER — Encounter: Payer: Self-pay | Admitting: Orthopedic Surgery

## 2019-09-28 NOTE — Progress Notes (Signed)
Office Visit Note   Patient: Jennifer Whitehead           Date of Birth: July 11, 1951           MRN: [id number] Visit Date: 09/24/2019 Requested by: Denita Lung, MD [place],  [place] [id number] PCP: Denita Lung, MD  Subjective: Chief Complaint  Patient presents with  . Right Hip - Pain    HPI: Noted the patient with 30-month history of right hip pain.  Does wake her up from sleep at night.  Hard for her to sleep on the right-hand side.  Denies any pain in the groin area.  Radiates down the lateral thigh.  Cetamide helps not so much that she is also trying Mobic.  She has had no injections.  She does do a lot of dog training.              ROS: All systems reviewed are negative as they relate to the chief complaint within the history of present illness.  Patient denies  fevers or chills.   Assessment & Plan: Visit Diagnoses:  1. Pain in right hip   2. Trochanteric bursitis, right hip     Plan: Impression is right hip trochanteric bursitis.  Plan is iliotibial band stretching.  We will try to get that set up with therapy and home exercise program.  If that does not work she should come back in a couple of months for ultrasound-guided trochanteric bursa injection.  Follow-up at that time if needed.  Does not look like it is coming from the groin itself or hip joint and it does not look like it is from the back.  Follow-Up Instructions: No follow-ups on file.   Orders:  Orders Placed This Encounter  Procedures  . XR HIP UNILAT W OR W/O PELVIS 2-3 VIEWS RIGHT   No orders of the defined types were placed in this encounter.     Procedures: No procedures performed   Clinical Data: No additional findings.  Objective: Vital Signs: There were no vitals taken for this visit.  Physical Exam:   Constitutional: Patient appears well-developed HEENT:  Head: Normocephalic Eyes:EOM are normal Neck: Normal range of motion Cardiovascular: Normal rate  Pulmonary/chest: Effort normal Neurologic: Patient is alert Skin: Skin is warm Psychiatric: Patient has normal mood and affect    Ortho Exam: Ortho exam demonstrates normal gait alignment.  Knee is functioning well which was replaced.  Patient does have tenderness to palpation in the trochanteric region.  No groin pain with internal X rotation of the leg and no nerve root tension signs.  No definite paresthesias L1 S1 bilaterally.  No other masses lymphadenopathy or skin changes noted in that right hip and leg region.  Specialty Comments:  No specialty comments available.  Imaging: No results found.   PMFS History: Patient Active Problem List   Diagnosis Date Noted  . Unilateral primary osteoarthritis, left knee   . At high risk for breast cancer 05/09/2016   Past Medical History:  Diagnosis Date  . Arthritis    in both knees    Family History  Problem Relation Age of Onset  . Alcohol abuse Father     Past Surgical History:  Procedure Laterality Date  . ANTERIOR CRUCIATE LIGAMENT REPAIR Left   . BUNIONECTOMY Bilateral   . dental bone grafts    . HARDWARE REMOVAL Left 09/24/2018   Procedure: Hardware Removal Left Knee;  Surgeon: Meredith Pel, MD;  Location: [facility];  Service: Orthopedics;  Laterality: Left;  . RADIAL HEAD EXCISION Left   . TOTAL KNEE ARTHROPLASTY Left 09/24/2018   Procedure: LEFT TOTAL KNEE ARTHROPLASTY;  Surgeon: Cammy Copa, MD;  Location: [facility] OR;  Service: Orthopedics;  Laterality: Left;   Social History   Occupational History  . Not on file  Tobacco Use  . Smoking status: Former Smoker    Packs/day: 0.25    Years: 4.00    Pack years: 1.00    Types: Cigarettes    Quit date: 09/16/1976    Years since quitting: 43.0  . Smokeless tobacco: Never Used  Substance and Sexual Activity  . Alcohol use: Yes    Comment: several times a week  . Drug use: Never  . Sexual activity: Yes

## 2020-08-09 ENCOUNTER — Other Ambulatory Visit: Payer: Self-pay

## 2020-08-09 ENCOUNTER — Ambulatory Visit: Payer: No Typology Code available for payment source | Admitting: Family Medicine

## 2020-08-09 ENCOUNTER — Encounter: Payer: Self-pay | Admitting: Family Medicine

## 2020-08-09 VITALS — BP 132/82 | HR 77 | Temp 97.6°F | Ht 63.5 in | Wt 183.4 lb

## 2020-08-09 DIAGNOSIS — Z1159 Encounter for screening for other viral diseases: Secondary | ICD-10-CM

## 2020-08-09 DIAGNOSIS — Z79899 Other long term (current) drug therapy: Secondary | ICD-10-CM

## 2020-08-09 DIAGNOSIS — Z96652 Presence of left artificial knee joint: Secondary | ICD-10-CM | POA: Diagnosis not present

## 2020-08-09 DIAGNOSIS — Z13220 Encounter for screening for lipoid disorders: Secondary | ICD-10-CM

## 2020-08-09 DIAGNOSIS — Z9189 Other specified personal risk factors, not elsewhere classified: Secondary | ICD-10-CM | POA: Diagnosis not present

## 2020-08-09 DIAGNOSIS — Z0000 Encounter for general adult medical examination without abnormal findings: Secondary | ICD-10-CM | POA: Diagnosis not present

## 2020-08-09 NOTE — Progress Notes (Signed)
   Subjective:    Patient ID: Jennifer Whitehead, female    DOB: 09-02-1950, 68 y.o.   MRN: [id number]  HPI She is here for complete examination.  She has no particular concerns or complaints.  She has had a TKR.  She still uses Mobic.  She has tried to come off of that and use only Tylenol but has been unsuccessful.  She states that it helps with various aches and pains.  She is getting ready to have a mammogram.  She does continue on hormone replacement given to her by her gynecologist.  Otherwise she has no concerns or complaints.  She has now retired.  She does keep her self quite physically active.  Her marriage is going well.  Family and social history as well as health maintenance and immunizations was reviewed   Review of Systems  All other systems reviewed and are negative.      Objective:   Physical Exam  Alert and in no distress. Tympanic membranes and canals are normal. Pharyngeal area is normal. Neck is supple without adenopathy or thyromegaly. Cardiac exam shows a regular sinus rhythm without murmurs or gallops. Lungs are clear to auscultation.  Abdominal exam shows active bowel sounds without masses or tenderness.        Assessment & Plan:  Routine general medical examination at a health care facility - Plan: CBC with Differential/Platelet, Comprehensive metabolic panel, Lipid panel  At high risk for breast cancer  Status post total left knee replacement  Screening for lipid disorders - Plan: Lipid panel  Encounter for long-term (current) use of medications - Plan: CBC with Differential/Platelet, Comprehensive metabolic panel, Lipid panel  Need for hepatitis C screening test - Plan: Hepatitis C antibody I discussed the use of Mobic long-term in regard to fluid retention and renal issues.  She is aware of this.  Encouraged her to continue with her physical activity level which seems to be fairly good.  Also discussed cutting back on carbohydrates.

## 2020-08-10 LAB — COMPREHENSIVE METABOLIC PANEL
ALT: 26 IU/L (ref 0–32)
AST: 18 IU/L (ref 0–40)
Albumin/Globulin Ratio: 2 (ref 1.2–2.2)
Albumin: 4.6 g/dL (ref 3.8–4.8)
Alkaline Phosphatase: 53 IU/L (ref 44–121)
BUN/Creatinine Ratio: 23 (ref 12–28)
BUN: 16 mg/dL (ref 8–27)
Bilirubin Total: 0.3 mg/dL (ref 0.0–1.2)
CO2: 22 mmol/L (ref 20–29)
Calcium: 9.6 mg/dL (ref 8.7–10.3)
Chloride: 103 mmol/L (ref 96–106)
Creatinine, Ser: 0.7 mg/dL (ref 0.57–1.00)
GFR calc Af Amer: 102 mL/min/{1.73_m2} (ref >59)
GFR calc non Af Amer: 88 mL/min/{1.73_m2} (ref >59)
Globulin, Total: 2.3 g/dL (ref 1.5–4.5)
Glucose: 99 mg/dL (ref 65–99)
Potassium: 4.9 mmol/L (ref 3.5–5.2)
Sodium: 139 mmol/L (ref 134–144)
Total Protein: 6.9 g/dL (ref 6.0–8.5)

## 2020-08-10 LAB — CBC WITH DIFFERENTIAL/PLATELET
Basophils Absolute: 0.1 10*3/uL (ref 0.0–0.2)
Basos: 2 %
EOS (ABSOLUTE): 0.3 10*3/uL (ref 0.0–0.4)
Eos: 5 %
Hematocrit: 39.3 % (ref 34.0–46.6)
Hemoglobin: 13.4 g/dL (ref 11.1–15.9)
Immature Grans (Abs): 0 10*3/uL (ref 0.0–0.1)
Immature Granulocytes: 0 %
Lymphocytes Absolute: 1.6 10*3/uL (ref 0.7–3.1)
Lymphs: 23 %
MCH: 31.1 pg (ref 26.6–33.0)
MCHC: 34.1 g/dL (ref 31.5–35.7)
MCV: 91 fL (ref 79–97)
Monocytes Absolute: 0.7 10*3/uL (ref 0.1–0.9)
Monocytes: 10 %
Neutrophils Absolute: 4.2 10*3/uL (ref 1.4–7.0)
Neutrophils: 60 %
Platelets: 275 10*3/uL (ref 150–450)
RBC: 4.31 x10E6/uL (ref 3.77–5.28)
RDW: 11.9 % (ref 11.7–15.4)
WBC: 6.9 10*3/uL (ref 3.4–10.8)

## 2020-08-10 LAB — HEPATITIS C ANTIBODY: Hep C Virus Ab: <0.1 s/co ratio (ref 0.0–0.9)

## 2020-08-10 LAB — LIPID PANEL
Chol/HDL Ratio: 3.1 ratio (ref 0.0–4.4)
Cholesterol, Total: 245 mg/dL — ABNORMAL HIGH (ref 100–199)
HDL: 78 mg/dL (ref >39)
LDL Chol Calc (NIH): 150 mg/dL — ABNORMAL HIGH (ref 0–99)
Triglycerides: 97 mg/dL (ref 0–149)
VLDL Cholesterol Cal: 17 mg/dL (ref 5–40)

## 2020-12-02 ENCOUNTER — Ambulatory Visit: Payer: No Typology Code available for payment source | Admitting: Orthopedic Surgery

## 2020-12-02 ENCOUNTER — Ambulatory Visit (INDEPENDENT_AMBULATORY_CARE_PROVIDER_SITE_OTHER): Payer: No Typology Code available for payment source

## 2020-12-02 DIAGNOSIS — M1711 Unilateral primary osteoarthritis, right knee: Secondary | ICD-10-CM | POA: Diagnosis not present

## 2020-12-02 DIAGNOSIS — M25561 Pain in right knee: Secondary | ICD-10-CM | POA: Diagnosis not present

## 2020-12-04 ENCOUNTER — Encounter: Payer: Self-pay | Admitting: Orthopedic Surgery

## 2020-12-04 NOTE — Progress Notes (Signed)
Office Visit Note   Patient: Jennifer Whitehead           Date of Birth: August 08, 1951           MRN: [id number] Visit Date: 12/02/2020 Requested by: Ronnald Nian, MD [address] [place],  [place] [address] PCP: Ronnald Nian, MD  Subjective: Chief Complaint  Patient presents with  . Right Knee - Pain    HPI: Jennifer Whitehead is a 70 y.o. female who presents to the office complaining of right knee pain.  Patient notes pain for several months.  No injury.  Pain comes and goes.  She has just finished 8 straight days of work at [employer] which involves a lot of walking on concrete.  Localizes pain to the medial and anterior aspect of the right knee with occasional posterior pain.  No radicular pain, radiation of pain, groin pain.  She does note that her leg feels weak at times but denies any actual instability events or any locking/mechanical symptoms.  Pain does not wake her up at night.  She is currently retired and she mostly just trains and shows dogs.  She has history of prior left total knee arthroplasty in 2020 by Dr. August Saucer.  Doing well from this.  She takes occasional Tylenol and Mobic 7.5 mg for her pain.  Denies any history of diabetes..                ROS: All systems reviewed are negative as they relate to the chief complaint within the history of present illness.  Patient denies fevers or chills.  Assessment & Plan: Visit Diagnoses:  1. Unilateral primary osteoarthritis, right knee   2. Right knee pain, unspecified chronicity     Plan: Patient is a 70 year old female presents complaint of right knee pain.  She has history of right knee pain over the last several months.  Does not wake her up at night and does not limit her activity significantly.  She is currently retired and train/shows dogs.  On exam she has excellent range of motion with only trace effusion and no significant varus/valgus alignment.  Radiographs taken today show right knee with some medial  compartment joint space narrowing and early osteophytic lipping of all 3 compartments.  Discussed options available patient.  She is not having significant pain to the point where she is considering an injection.  She would like to continue with her oral medications and follow-up in the office as needed.  Follow-Up Instructions: No follow-ups on file.   Orders:  Orders Placed This Encounter  Procedures  . XR KNEE 3 VIEW RIGHT   No orders of the defined types were placed in this encounter.     Procedures: No procedures performed   Clinical Data: No additional findings.  Objective: Vital Signs: There were no vitals taken for this visit.  Physical Exam:  Constitutional: Patient appears well-developed HEENT:  Head: Normocephalic Eyes:EOM are normal Neck: Normal range of motion Cardiovascular: Normal rate Pulmonary/chest: Effort normal Neurologic: Patient is alert Skin: Skin is warm Psychiatric: Patient has normal mood and affect  Ortho Exam: Ortho exam demonstrates right knee with trace effusion.  Extends to 0 degrees and flexes to 125 degrees.  Mild tenderness over the medial and lateral joint lines.  No varus/valgus alignment noted.  No calf tenderness.  Able to perform straight leg raise.  No pain with hip range of motion.  Specialty Comments:  No specialty comments available.  Imaging: No results found.  PMFS History: Patient Active Problem List   Diagnosis Date Noted  . S/P TKR (total knee replacement)   . At high risk for breast cancer 05/09/2016   Past Medical History:  Diagnosis Date  . Arthritis    in both knees    Family History  Problem Relation Age of Onset  . Alcohol abuse Father     Past Surgical History:  Procedure Laterality Date  . ANTERIOR CRUCIATE LIGAMENT REPAIR Left   . BUNIONECTOMY Bilateral   . dental bone grafts    . HARDWARE REMOVAL Left 09/24/2018   Procedure: Hardware Removal Left Knee;  Surgeon: Cammy Copa, MD;   Location: [facility] OR;  Service: Orthopedics;  Laterality: Left;  . RADIAL HEAD EXCISION Left   . TOTAL KNEE ARTHROPLASTY Left 09/24/2018   Procedure: LEFT TOTAL KNEE ARTHROPLASTY;  Surgeon: Cammy Copa, MD;  Location: [facility] OR;  Service: Orthopedics;  Laterality: Left;   Social History   Occupational History  . Not on file  Tobacco Use  . Smoking status: Former Smoker    Packs/day: 0.25    Years: 4.00    Pack years: 1.00    Types: Cigarettes    Quit date: 09/16/1976    Years since quitting: 44.2  . Smokeless tobacco: Never Used  Substance and Sexual Activity  . Alcohol use: Yes    Comment: several times a week  . Drug use: Never  . Sexual activity: Yes

## 2020-12-05 ENCOUNTER — Encounter: Payer: Self-pay | Admitting: Orthopedic Surgery

## 2021-01-17 ENCOUNTER — Ambulatory Visit
Admission: EM | Admit: 2021-01-17 | Discharge: 2021-01-17 | Discharge status: Absent without leave | Payer: No Typology Code available for payment source

## 2021-01-17 ENCOUNTER — Other Ambulatory Visit: Payer: Self-pay

## 2021-07-27 ENCOUNTER — Ambulatory Visit: Payer: No Typology Code available for payment source | Admitting: Orthopedic Surgery

## 2021-07-27 ENCOUNTER — Other Ambulatory Visit: Payer: Self-pay

## 2021-07-27 ENCOUNTER — Encounter: Payer: Self-pay | Admitting: Orthopedic Surgery

## 2021-07-27 VITALS — Ht 63.0 in | Wt 183.0 lb

## 2021-07-27 DIAGNOSIS — E663 Overweight: Secondary | ICD-10-CM

## 2021-07-27 DIAGNOSIS — M1711 Unilateral primary osteoarthritis, right knee: Secondary | ICD-10-CM | POA: Diagnosis not present

## 2021-07-29 ENCOUNTER — Encounter: Payer: Self-pay | Admitting: Orthopedic Surgery

## 2021-07-29 NOTE — Progress Notes (Signed)
Office Visit Note   Patient: Jennifer Whitehead           Date of Birth: 06-01-1951           MRN: [id number] Visit Date: 07/27/2021 Requested by: Ronnald Nian, MD [address] [place],  [place] [address] PCP: Ronnald Nian, MD  Subjective: Chief Complaint  Patient presents with   Right Knee - Follow-up    HPI: Jennifer Whitehead is a 70 y.o. female who presents to the office complaining of right knee pain.  Patient states that she initially had moderate to severe right knee pain when she made the appointment but it has improved in the last week.  She has history of right knee osteoarthritis but overall the pain has been controlled with exercise program as well as occasional Tylenol, Mobic, Voltaren gel use.  Most of her pain is lateral and posterior.  No recent injuries or falls.  She goes to the gym 3 times per week as well as once per week with a trainer.  Mostly focuses on strength training and walking dogs when she is exercising.  She quit the treadmill for elliptical in April after her last visit.  Denies any groin pain or radicular pain..  She has had left knee replacement and is doing well with that.              ROS: All systems reviewed are negative as they relate to the chief complaint within the history of present illness.  Patient denies fevers or chills.  Assessment & Plan: Visit Diagnoses:  1. Unilateral primary osteoarthritis, right knee   2. Overweight     Plan: Patient is a 70 year old female who presents for right knee pain.  She had a flareup of her arthritis that prompted the appointment but this has improved by itself in the last week.  Discussed all of the modalities she is using to treat her pain which all seem appropriate with regular exercise and anti-inflammatory medications.  She does not want to try any joint injections and she says that she wants to hold off on any injections for good really.  She has history of left total knee arthroplasty and states  that the right knee is not bothering her enough to go through that process again at this point.  She is really doing everything that she can for her knee pain so plan to stay the course.  She did inquire about losing weight to help with knee pain which should be helpful to some degree.  She has been trying to lose weight but would like some assistance with this so plan to refer to weight loss clinic.  Plan for follow-up with the office as needed if her knee pain flares up again or if she would like to discuss surgery.  Patient agreed with plan.  Follow-up as needed.  Follow-Up Instructions: No follow-ups on file.   Orders:  Orders Placed This Encounter  Procedures   Amb Ref to Medical Weight Management   No orders of the defined types were placed in this encounter.     Procedures: No procedures performed   Clinical Data: No additional findings.  Objective: Vital Signs: Ht 5\' 3"  (1.6 m)   Wt 183 lb (83 kg)   BMI 32.42 kg/m   Physical Exam:  Constitutional: Patient appears well-developed HEENT:  Head: Normocephalic Eyes:EOM are normal Neck: Normal range of motion Cardiovascular: Normal rate Pulmonary/chest: Effort normal Neurologic: Patient is alert Skin: Skin is warm Psychiatric:  Patient has normal mood and affect  Ortho Exam: Ortho exam demonstrates right knee with 0 degrees extension and 115 degrees of knee flexion.  Tenderness at to palpation over the medial and lateral joint lines.  No pain with hip range of motion.  Able to perform straight leg raise.  No calf tenderness.  Negative Homans' sign.  Specialty Comments:  No specialty comments available.  Imaging: No results found.   PMFS History: Patient Active Problem List   Diagnosis Date Noted   S/P TKR (total knee replacement)    At high risk for breast cancer 05/09/2016   Past Medical History:  Diagnosis Date   Arthritis    in both knees    Family History  Problem Relation Age of Onset   Alcohol abuse  Father     Past Surgical History:  Procedure Laterality Date   ANTERIOR CRUCIATE LIGAMENT REPAIR Left    BUNIONECTOMY Bilateral    dental bone grafts     HARDWARE REMOVAL Left 09/24/2018   Procedure: Hardware Removal Left Knee;  Surgeon: Cammy Copa, MD;  Location: [facility] OR;  Service: Orthopedics;  Laterality: Left;   RADIAL HEAD EXCISION Left    TOTAL KNEE ARTHROPLASTY Left 09/24/2018   Procedure: LEFT TOTAL KNEE ARTHROPLASTY;  Surgeon: Cammy Copa, MD;  Location: [facility] OR;  Service: Orthopedics;  Laterality: Left;   Social History   Occupational History   Not on file  Tobacco Use   Smoking status: Former    Packs/day: 0.25    Years: 4.00    Pack years: 1.00    Types: Cigarettes    Quit date: 09/16/1976    Years since quitting: 44.8   Smokeless tobacco: Never  Substance and Sexual Activity   Alcohol use: Yes    Comment: several times a week   Drug use: Never   Sexual activity: Yes

## 2021-08-16 ENCOUNTER — Encounter: Payer: Self-pay | Admitting: Family Medicine

## 2021-08-16 ENCOUNTER — Other Ambulatory Visit: Payer: Self-pay

## 2021-08-16 ENCOUNTER — Ambulatory Visit: Payer: No Typology Code available for payment source | Admitting: Family Medicine

## 2021-08-16 VITALS — BP 132/86 | HR 73 | Temp 97.9°F | Ht 63.0 in | Wt 186.6 lb

## 2021-08-16 DIAGNOSIS — Z9189 Other specified personal risk factors, not elsewhere classified: Secondary | ICD-10-CM | POA: Diagnosis not present

## 2021-08-16 DIAGNOSIS — Z0000 Encounter for general adult medical examination without abnormal findings: Secondary | ICD-10-CM

## 2021-08-16 DIAGNOSIS — M1990 Unspecified osteoarthritis, unspecified site: Secondary | ICD-10-CM

## 2021-08-16 DIAGNOSIS — Z1231 Encounter for screening mammogram for malignant neoplasm of breast: Secondary | ICD-10-CM | POA: Diagnosis not present

## 2021-08-16 DIAGNOSIS — E2839 Other primary ovarian failure: Secondary | ICD-10-CM | POA: Diagnosis not present

## 2021-08-16 DIAGNOSIS — Z13220 Encounter for screening for lipoid disorders: Secondary | ICD-10-CM

## 2021-08-16 DIAGNOSIS — Z96652 Presence of left artificial knee joint: Secondary | ICD-10-CM

## 2021-08-16 DIAGNOSIS — Z2821 Immunization not carried out because of patient refusal: Secondary | ICD-10-CM

## 2021-08-16 LAB — POCT URINALYSIS DIP (PROADVANTAGE DEVICE)
Bilirubin, UA: NEGATIVE
Blood, UA: NEGATIVE
Glucose, UA: NEGATIVE mg/dL
Ketones, POC UA: NEGATIVE mg/dL
Leukocytes, UA: NEGATIVE
Nitrite, UA: NEGATIVE
Protein Ur, POC: NEGATIVE mg/dL
Specific Gravity, Urine: 1.01
Urobilinogen, Ur: 0.2
pH, UA: 6 (ref 5.0–8.0)

## 2021-08-16 NOTE — Addendum Note (Signed)
Addended by: Renelda Loma on: 08/16/2021 12:55 PM   Modules accepted: Orders

## 2021-08-16 NOTE — Progress Notes (Signed)
° °  Subjective:    Patient ID: Jennifer Whitehead, female    DOB: [birth year], 70 y.o.   MRN: [id number]  HPI She is here for complete examination.  She has had a left TKR.  She also complains of intermittent difficulty with arthritislike symptoms especially in her right thumb.  She does a lot of sewing and this can sometimes cause some slight discomfort.  She has not noticed any trouble with with any other joints.  She is also noted some difficulty with sexual activity with some discomfort from that.  She is presently on estrogen replacement given to her by her gynecologist.  Her immunizations were reviewed.  She is not interested in another COVID-vaccine.  Otherwise her family and social history as well as health maintenance and immunizations was reviewed.  Her marriage is going well.  She is now retired.   Review of Systems  All other systems reviewed and are negative.     Objective:   Physical Exam Alert and in no distress. Tympanic membranes and canals are normal. Pharyngeal area is normal. Neck is supple without adenopathy or thyromegaly. Cardiac exam shows a regular sinus rhythm without murmurs or gallops. Lungs are clear to auscultation.        Assessment & Plan:  Routine general medical examination at a health care facility - Plan: CBC with Differential/Platelet, Comprehensive metabolic panel, Lipid panel  Status post total left knee replacement  At high risk for breast cancer - Plan: MM Digital Screening  Encounter for screening mammogram for malignant neoplasm of breast - Plan: MM Digital Screening  Estrogen deficiency - Plan: DG Bone Density  Screening for lipid disorders  Arthritis  Immunization refused I discussed the treatment of arthritis with Tylenol as well as range of motion.  She will continue to get mammograms on a yearly or every other year basis. Discussed the difficulty with sexual activity and since she is already on an estrogen, do not think that a topical  would be worthwhile.  Did recommend using a lubricant to help with that. Discussed immunizations however she is not interested in pursuing any further COVID injections.

## 2021-08-17 LAB — CBC WITH DIFFERENTIAL/PLATELET
Basophils Absolute: 0.1 10*3/uL (ref 0.0–0.2)
Basos: 1 %
EOS (ABSOLUTE): 0.2 10*3/uL (ref 0.0–0.4)
Eos: 4 %
Hematocrit: 40.6 % (ref 34.0–46.6)
Hemoglobin: 13.4 g/dL (ref 11.1–15.9)
Immature Grans (Abs): 0 10*3/uL (ref 0.0–0.1)
Immature Granulocytes: 0 %
Lymphocytes Absolute: 1.8 10*3/uL (ref 0.7–3.1)
Lymphs: 27 %
MCH: 30.2 pg (ref 26.6–33.0)
MCHC: 33 g/dL (ref 31.5–35.7)
MCV: 91 fL (ref 79–97)
Monocytes Absolute: 0.6 10*3/uL (ref 0.1–0.9)
Monocytes: 10 %
Neutrophils Absolute: 3.8 10*3/uL (ref 1.4–7.0)
Neutrophils: 58 %
Platelets: 304 10*3/uL (ref 150–450)
RBC: 4.44 x10E6/uL (ref 3.77–5.28)
RDW: 12.6 % (ref 11.7–15.4)
WBC: 6.5 10*3/uL (ref 3.4–10.8)

## 2021-08-17 LAB — COMPREHENSIVE METABOLIC PANEL
ALT: 17 IU/L (ref 0–32)
AST: 20 IU/L (ref 0–40)
Albumin/Globulin Ratio: 2.3 — ABNORMAL HIGH (ref 1.2–2.2)
Albumin: 4.8 g/dL — ABNORMAL HIGH (ref 3.7–4.7)
Alkaline Phosphatase: 50 IU/L (ref 44–121)
BUN/Creatinine Ratio: 23 (ref 12–28)
BUN: 16 mg/dL (ref 8–27)
Bilirubin Total: 0.3 mg/dL (ref 0.0–1.2)
CO2: 26 mmol/L (ref 20–29)
Calcium: 9.7 mg/dL (ref 8.7–10.3)
Chloride: 103 mmol/L (ref 96–106)
Creatinine, Ser: 0.71 mg/dL (ref 0.57–1.00)
Globulin, Total: 2.1 g/dL (ref 1.5–4.5)
Glucose: 95 mg/dL (ref 70–99)
Potassium: 5 mmol/L (ref 3.5–5.2)
Sodium: 141 mmol/L (ref 134–144)
Total Protein: 6.9 g/dL (ref 6.0–8.5)
eGFR: 91 mL/min/{1.73_m2} (ref >59)

## 2021-08-17 LAB — LIPID PANEL
Chol/HDL Ratio: 3.1 ratio (ref 0.0–4.4)
Cholesterol, Total: 236 mg/dL — ABNORMAL HIGH (ref 100–199)
HDL: 76 mg/dL (ref >39)
LDL Chol Calc (NIH): 146 mg/dL — ABNORMAL HIGH (ref 0–99)
Triglycerides: 84 mg/dL (ref 0–149)
VLDL Cholesterol Cal: 14 mg/dL (ref 5–40)

## 2021-09-22 ENCOUNTER — Encounter: Payer: Self-pay | Admitting: Medical

## 2021-09-22 ENCOUNTER — Telehealth (INDEPENDENT_AMBULATORY_CARE_PROVIDER_SITE_OTHER): Payer: No Typology Code available for payment source | Admitting: Medical

## 2021-09-22 ENCOUNTER — Other Ambulatory Visit: Payer: Self-pay

## 2021-09-22 VITALS — Wt 170.0 lb

## 2021-09-22 DIAGNOSIS — U071 COVID-19: Secondary | ICD-10-CM | POA: Diagnosis not present

## 2021-09-22 MED ORDER — MOLNUPIRAVIR EUA 200MG CAPSULE: 4.0000 | ORAL_CAPSULE | Freq: Two times a day (BID) | ORAL | 0 refills | Status: AC

## 2021-09-22 NOTE — Progress Notes (Signed)
Subjective:     Patient ID: Jennifer Whitehead, female   DOB: [birth year], 71 y.o.   MRN: [id number]  This visit type was conducted due to national recommendations for restrictions regarding the COVID-19 Pandemic (e.g. social distancing) in an effort to limit this patient's exposure and mitigate transmission in our community.  Due to their co-morbid illnesses, this patient is at least at moderate risk for complications without adequate follow up.  This format is felt to be most appropriate for this patient at this time.    Documentation for virtual audio and video telecommunications through [facility] encounter:  The patient was located at home. The provider was located in the office. The patient did consent to this visit and is aware of possible charges through their insurance for this visit.  The other persons participating in this telemedicine service were husband  time spent on call was 20 minutes and in review of previous records 20 minutes total.  This virtual service is not related to other E/M service within previous 7 days.   HPI Chief Complaint  Patient presents with   Covid Positive    Symtpoms started Tuesday night- cough, diarrhea, yesterday morning- chills, fatigue. Positive covid- this morning   Virtual consult for illness.  She is accompanied by her husband today who is also sick with COVID.  She tested positive prior to him getting symptoms and testing positive.  Her symptoms began 2 nights ago.  Her symptoms include cough, some diarrhea, chills, mild cough, mild headache, congestion.  Otherwise no other symptoms.  No wheezing or shortness of breath.  No fever.  No nausea or vomiting.  No sore throat.  No loss of taste or smell.  She has never had COVID infection.  She has had 2 prior COVID vaccines  Past Medical History:  Diagnosis Date   Arthritis    in both knees   Current Outpatient Medications on File Prior to Visit  Medication Sig Dispense Refill   [name]  0.5-0.1 MG tablet      Biotin 1000 MCG tablet Take 1,000 mcg by mouth daily.     Calcium-Magnesium-Vitamin D (CALCIUM 1200+D3 PO) Take 1 tablet by mouth daily.     diazepam (VALIUM) 5 MG tablet Take 5 mg by mouth 2 (two) times daily.     meloxicam (MOBIC) 15 MG tablet Take 7.5 mg by mouth daily.     Multiple Vitamin (MULTIVITAMIN WITH MINERALS) TABS tablet Take 1 tablet by mouth daily.     Omega-3 Fatty Acids (FISH OIL PO) Take by mouth.     vitamin C (ASCORBIC ACID) 500 MG tablet Take 500 mg by mouth daily.     No current facility-administered medications on file prior to visit.     Review of Systems As in subjective    Objective:   Physical Exam Due to coronavirus pandemic stay at home measures, patient visit was virtual and they were not examined in person.   Wt 170 lb (77.1 kg)    BMI 30.11 kg/m  Mildly ill-appearing, white female No acute distress Answers questions in complete sentences, no labored breathing or wheezing     Assessment:     Encounter Diagnosis  Name Primary?   COVID-19 virus infection Yes       Plan:      General recommendations: I recommend you rest, hydrate well with water and clear fluids throughout the day.   You can use Tylenol for pain or fever You can use over the counter Delsym for  cough. You can use over the counter Emetrol for nausea.    Begin Molnupiravir given benefit of reducing chance of severe illness or hospitalization.     If you are having trouble breathing, if you are very weak, have high fever 103 or higher consistently despite Tylenol, or uncontrollable nausea and vomiting, then call or go to the emergency department.    If you have other questions or have other symptoms or questions you are concerned about then please make a virtual visit  Covid symptoms such as fatigue and cough can linger over 2 weeks, even after the initial fever, aches, chills, and other initial symptoms.   Self Quarantine: The CDC, Centers for  Disease Control has recommended a self quarantine of 5 days from the start of your illness until you are symptom-free including at least 24 hours of no symptoms including no fever, no shortness of breath, and no body aches and chills, by day 5 before returning to work or general contact with the public.  What does self quarantine mean: avoiding contact with people as much as possible.   Particularly in your house, isolate your self from others in a separate room, wear a mask when possible in the room, particularly if coughing a lot.   Have others bring food, water, medications, etc., to your door, but avoid direct contact with your household contacts during this time to avoid spreading the infection to them.   If you have a separate bathroom and living quarters during the next 2 weeks away from others, that would be preferable.    If you can't completely isolate, then wear a mask, wash hands frequently with soap and water for at least 15 seconds, minimize close contact with others, and have a friend or family member check regularly from a distance to make sure you are not getting seriously worse.     You should not be going out in public, should not be going to stores, to work or other public places until all your symptoms have resolved and at least 5 days + 24 hours of no symptoms at all have transpired.   Ideally you should avoid contact with others for a full 5 days if possible.  One of the goals is to limit spread to high risk people; people that are older and elderly, people with multiple health issues like diabetes, heart disease, lung disease, and anybody that has weakened immune systems such as people with cancer or on immunosuppressive therapy.     Islah was seen today for covid positive.  Diagnoses and all orders for this visit:  COVID-19 virus infection  Other orders -     molnupiravir EUA (LAGEVRIO) 200 mg CAPS capsule; Take 4 capsules (800 mg total) by mouth 2 (two) times daily for 5  days.    F/u prn

## 2021-10-03 ENCOUNTER — Ambulatory Visit: Payer: No Typology Code available for payment source | Admitting: Medical

## 2021-10-03 ENCOUNTER — Other Ambulatory Visit: Payer: Self-pay

## 2021-10-03 ENCOUNTER — Other Ambulatory Visit: Payer: Self-pay | Admitting: Medical

## 2021-10-03 VITALS — BP 124/80 | HR 82 | Temp 97.9°F | Wt 181.2 lb

## 2021-10-03 DIAGNOSIS — R197 Diarrhea, unspecified: Secondary | ICD-10-CM | POA: Diagnosis not present

## 2021-10-03 DIAGNOSIS — J069 Acute upper respiratory infection, unspecified: Secondary | ICD-10-CM

## 2021-10-03 DIAGNOSIS — Z8616 Personal history of COVID-19: Secondary | ICD-10-CM | POA: Diagnosis not present

## 2021-10-03 MED ORDER — CIPROFLOXACIN HCL 500 MG PO TABS: 500.0000 mg | ORAL_TABLET | Freq: Two times a day (BID) | ORAL | 0 refills | Status: AC

## 2021-10-03 NOTE — Progress Notes (Signed)
Subjective:  Jennifer Whitehead is a 71 y.o. female who presents for Chief Complaint  Patient presents with   Diarrhea    Diarrhea for 2 weeks. 3 times a day having diarrhea spells. Using over the counter med     Here for concern for diarrhea.  We did a virtual visit on September 22, 2021 for COVID infection.  Ever since she came down with COVID illness she has had 2-3 loose stools daily that have persisted now going on 11 days.  She continues to have 2-3 loose stools daily.  Initially some of the stools were darker color almost black color.  Now they are more watery brown but loose.  She has not had any blood.  No recent fevers.  No particular pain.  She has a little bit of chest congestion left over from the COVID illness.  But most of her symptoms have resolved.  No nausea or vomiting.  Appetite is down.  She did end up using the molnupiravir.  She also has been doing probiotic/yogurt.    No recent sick contacts with diarrhea.  Her husband who also had COVID at the same time did not develop diarrhea.  No recent antibiotic use.  No recent hospital contact.  No recent foreign travel.  No other aggravating or relieving factors.    No other c/o.  The following portions of the patient's history were reviewed and updated as appropriate: allergies, current medications, past family history, past medical history, past social history, past surgical history and problem list.  ROS Otherwise as in subjective above    Objective: BP 124/80    Pulse 82    Temp 97.9 F (36.6 C)    Wt 181 lb 3.2 oz (82.2 kg)    SpO2 99%    BMI 32.10 kg/m   General appearance: alert, no distress, well developed, well nourished HEENT: normocephalic, sclerae anicteric, conjunctiva pink and moist, TMs pearly, nares patent, no discharge or erythema, pharynx normal Oral cavity: MMM, no lesions Neck: supple, no lymphadenopathy, no thyromegaly, no masses Heart: RRR, normal S1, S2, no murmurs Lungs: CTA bilaterally, no wheezes,  rhonchi, or rales Abdomen: +bs, soft, non tender, non distended, no masses, no hepatomegaly, no splenomegaly Pulses: 2+ radial pulses, 2+ pedal pulses, normal cap refill Ext: no edema   Assessment: Encounter Diagnoses  Name Primary?   Diarrhea of presumed infectious origin Yes   Diarrhea, unspecified type    History of COVID-19    Recent upper respiratory tract infection      Plan: We discussed her symptoms and concerns.  Her COVID symptoms have mostly resolved other than some mild congestion and ongoing loose stools.  We discussed different causes of loose stools.  Despite probiotic, some Imodium, and good hydration the loose stools persist.  We will send her on for a kit to test for GI profile.  Once she has collected the specimens we discussed possibly using a round of Cipro empirically.  She is tired of the diarrhea at this point and wants some type of treatment.  We discussed risk and benefits of medication.  Continue good rest and hydration, can continue Imodium once to twice a day.  Can continue probiotic.  Call if any new or worse symptoms in the coming days otherwise follow-up pending GI profile.   [name] was seen today for diarrhea.  Diagnoses and all orders for this visit:  Diarrhea of presumed infectious origin -     GI Profile, Stool, PCR  Diarrhea, unspecified type -  Cancel: GI Profile, Stool, PCR ° °History of COVID-19 °-     GI Profile, Stool, PCR ° °Recent upper respiratory tract infection °-     GI Profile, Stool, PCR ° ° ° °Follow up: pending labs °

## 2021-10-04 ENCOUNTER — Ambulatory Visit: Payer: No Typology Code available for payment source

## 2021-10-04 LAB — GI PROFILE, STOOL, PCR

## 2021-10-25 ENCOUNTER — Ambulatory Visit
Admission: RE | Admit: 2021-10-25 | Discharge: 2021-10-25 | Disposition: A | Discharge status: Home / Self Care | Payer: No Typology Code available for payment source | Source: Ambulatory Visit | Attending: Family Medicine | Admitting: Family Medicine

## 2022-02-20 ENCOUNTER — Ambulatory Visit
Admission: RE | Admit: 2022-02-20 | Discharge: 2022-02-20 | Disposition: A | Discharge status: Home / Self Care | Payer: No Typology Code available for payment source | Source: Ambulatory Visit | Attending: Family Medicine | Admitting: Family Medicine

## 2022-03-14 ENCOUNTER — Encounter: Payer: Medicare Other | Admitting: Obstetrics & Gynecology

## 2022-03-22 ENCOUNTER — Ambulatory Visit (INDEPENDENT_AMBULATORY_CARE_PROVIDER_SITE_OTHER): Payer: No Typology Code available for payment source | Admitting: Obstetrics & Gynecology

## 2022-03-22 ENCOUNTER — Encounter: Payer: Self-pay | Admitting: Obstetrics & Gynecology

## 2022-03-22 VITALS — BP 120/78 | HR 92 | Ht 63.25 in | Wt 175.0 lb

## 2022-03-22 DIAGNOSIS — Z780 Asymptomatic menopausal state: Secondary | ICD-10-CM

## 2022-03-22 DIAGNOSIS — M1990 Unspecified osteoarthritis, unspecified site: Secondary | ICD-10-CM

## 2022-03-22 DIAGNOSIS — F419 Anxiety disorder, unspecified: Secondary | ICD-10-CM

## 2022-03-22 DIAGNOSIS — Z01419 Encounter for gynecological examination (general) (routine) without abnormal findings: Secondary | ICD-10-CM | POA: Diagnosis not present

## 2022-03-22 DIAGNOSIS — N952 Postmenopausal atrophic vaginitis: Secondary | ICD-10-CM | POA: Diagnosis not present

## 2022-03-22 MED ORDER — MELOXICAM 15 MG PO TABS: 7.5000 mg | ORAL_TABLET | Freq: Every day | ORAL | 1 refills | Status: DC

## 2022-03-22 MED ORDER — ESTRADIOL 10 MCG VA TABS: 1.0000 | ORAL_TABLET | VAGINAL | 4 refills | Status: DC

## 2022-03-22 MED ORDER — DIAZEPAM 5 MG PO TABS: 5.0000 mg | ORAL_TABLET | Freq: Every day | ORAL | 1 refills | Status: DC

## 2022-03-22 NOTE — Progress Notes (Addendum)
Jennifer Whitehead [birth year] [id number]   History:    71 y.o. G0  RP:  New patient presenting for annual gyn exam   HPI: Postmenopause, still on HRT with the generic of Activella 0.5/0.1.  No BTB.  No pelvic pain.  No pain with IC, but some dryness.  Pap Neg in 2021.  No h/o abnormal Pap.  Will repeat Pap at 3-5 years.  Urine normal.  Occasional loose stools.  Breasts normal.  Mammo 09/2021 Neg.  Bone Density Normal 01/2022.  ColoGuard 2023.  BMI 30.76.  Good fitness.  Health labs with Fam MD.  Anxiety controled on Diazepam as needed.  Mobic for Arthritic pains.  Past medical history,surgical history, family history and social history were all reviewed and documented in the EPIC chart.  Gynecologic History No LMP recorded. Patient is postmenopausal.  Obstetric History OB History  Gravida Para Term Preterm AB Living  0 0 0 0 0 0  SAB IAB Ectopic Multiple Live Births  0 0 0 0 0     ROS: A ROS was performed and pertinent positives and negatives are included in the history. GENERAL: No fevers or chills. HEENT: No change in vision, no earache, sore throat or sinus congestion. NECK: No pain or stiffness. CARDIOVASCULAR: No chest pain or pressure. No palpitations. PULMONARY: No shortness of breath, cough or wheeze. GASTROINTESTINAL: No abdominal pain, nausea, vomiting or diarrhea, melena or bright red blood per rectum. GENITOURINARY: No urinary frequency, urgency, hesitancy or dysuria. MUSCULOSKELETAL: No joint or muscle pain, no back pain, no recent trauma. DERMATOLOGIC: No rash, no itching, no lesions. ENDOCRINE: No polyuria, polydipsia, no heat or cold intolerance. No recent change in weight. HEMATOLOGICAL: No anemia or easy bruising or bleeding. NEUROLOGIC: No headache, seizures, numbness, tingling or weakness. PSYCHIATRIC: No depression, no loss of interest in normal activity or change in sleep pattern.     Exam:   BP 120/78   Pulse 92   Ht 5' 3.25" (1.607 m)   Wt 175 lb (79.4 kg)    SpO2 97%   BMI 30.76 kg/m   Body mass index is 30.76 kg/m.  General appearance : Well developed well nourished female. No acute distress HEENT: Eyes: no retinal hemorrhage or exudates,  Neck supple, trachea midline, no carotid bruits, no thyroidmegaly Lungs: Clear to auscultation, no rhonchi or wheezes, or rib retractions  Heart: Regular rate and rhythm, no murmurs or gallops Breast:Examined in sitting and supine position were symmetrical in appearance, no palpable masses or tenderness,  no skin retraction, no nipple inversion, no nipple discharge, no skin discoloration, no axillary or supraclavicular lymphadenopathy Abdomen: no palpable masses or tenderness, no rebound or guarding Extremities: no edema or skin discoloration or tenderness  Pelvic: Vulva: Normal             Vagina: No gross lesions or discharge  Cervix: No gross lesions or discharge  Uterus  AV, normal size, shape and consistency, non-tender and mobile  Adnexa  Without masses or tenderness  Anus: Normal   Assessment/Plan:  71 y.o. female for annual exam   1. Well female exam with routine gynecological exam Postmenopause, still on HRT with the generic of Activella 0.5/0.1.  No BTB.  No pelvic pain.  No pain with IC, but some dryness.  Pap Neg in 2021.  No h/o abnormal Pap.  Will repeat Pap at 3-5 years.  Urine normal.  Occasional loose stools.  Breasts normal.  Mammo 09/2021 Neg.  Bone Density Normal 01/2022.  ColoGuard  2023.  BMI 30.76.  Good fitness.  Health labs with Fam MD.  Anxiety controled on Ativan as needed.  Mobic for Arthritic pains.  2. Postmenopause Recommend stopping systemic HRT as the risks outweigh the benefits at this point.  Risks of stroke and breast cancer especially.  3. Postmenopausal atrophic vaginitis  No pain with IC, but some dryness.  Start on Vagifem twice a week.  Prescription sent to pharmacy.  4. Anxiety Ativan as needed.  Fam MD will take over the prescription at Annual exam in  07/2022.  5. Arthritis Mobic represcribed until visit with Fam MD.  Other orders - VITAMIN D PO; Take by mouth. - meloxicam (MOBIC) 15 MG tablet; Take 0.5 tablets (7.5 mg total) by mouth daily. - diazepam (VALIUM) 5 MG tablet; Take 1 tablet (5 mg total) by mouth daily.   Genia Del MD, 9:34 AM 03/22/2022

## 2022-03-28 ENCOUNTER — Other Ambulatory Visit: Payer: Self-pay

## 2022-03-28 ENCOUNTER — Telehealth: Payer: Self-pay

## 2022-03-28 DIAGNOSIS — M1990 Unspecified osteoarthritis, unspecified site: Secondary | ICD-10-CM

## 2022-03-28 DIAGNOSIS — F419 Anxiety disorder, unspecified: Secondary | ICD-10-CM

## 2022-03-28 NOTE — Telephone Encounter (Signed)
Patient called because the Diazepam and Mobic Rx's were sent to [facility] in [place], [place].  She is unfamiliar with this pharmacy. She usually gets her prescriptions at [facility] locally.  I called the [facility] pharmacy to see if I could cancel these Rx's and pharmacist told me if she is not a Hospice Patient that they will not fill them anyway and they would have cancelled them there.  We need to resent the prescriptions to [facility] locally. Please sign.  Also, you might want to correct 7.26.23 office note "Anxiety controled on Ativan as needed".  Patient said she uses the Diazepam as you sent.

## 2022-03-31 MED ORDER — MELOXICAM 15 MG PO TABS: 7.5000 mg | ORAL_TABLET | Freq: Every day | ORAL | 1 refills | Status: DC

## 2022-03-31 MED ORDER — DIAZEPAM 5 MG PO TABS: 5.0000 mg | ORAL_TABLET | Freq: Every day | ORAL | 1 refills | Status: DC

## 2022-03-31 NOTE — Telephone Encounter (Signed)
Per ML: "Please put the orders in for Diazepam and Mobic to be sent to [facility] by me."   Rxs sent. Meloxicam sent via e-scribe. Valium printed/signed/faxed successfully.   Called pt, no answer. LDVM on machine per DPR.

## 2022-03-31 NOTE — Telephone Encounter (Signed)
Pt calling again today to inquire about previous message. Please look at and address at your earliest convenience. Thanks.

## 2022-04-25 ENCOUNTER — Ambulatory Visit: Payer: No Typology Code available for payment source | Admitting: Podiatry

## 2022-04-25 ENCOUNTER — Ambulatory Visit (INDEPENDENT_AMBULATORY_CARE_PROVIDER_SITE_OTHER): Payer: No Typology Code available for payment source

## 2022-04-25 ENCOUNTER — Encounter: Payer: Self-pay | Admitting: Podiatry

## 2022-04-25 DIAGNOSIS — M7750 Other enthesopathy of unspecified foot: Secondary | ICD-10-CM

## 2022-04-25 DIAGNOSIS — M778 Other enthesopathies, not elsewhere classified: Secondary | ICD-10-CM | POA: Diagnosis not present

## 2022-04-25 DIAGNOSIS — M87076 Idiopathic aseptic necrosis of unspecified foot: Secondary | ICD-10-CM

## 2022-04-25 DIAGNOSIS — M7751 Other enthesopathy of right foot: Secondary | ICD-10-CM

## 2022-04-25 NOTE — Patient Instructions (Signed)
Call [facility] to schedule your MRI at the below locations.  Please allow at least 1 business day after your visit to process the referral.  It may take longer depending on approval from insurance.  Please let me know if you have issues or problems scheduling the MRI     [facility] [phone] [address] [place], [place] [address]  

## 2022-04-25 NOTE — Progress Notes (Signed)
  Subjective:  Patient ID: Jennifer Whitehead, female    DOB: November 10, 1950,  MRN: [id number]  Chief Complaint  Patient presents with   Foot Pain    (np) right foot pain behind toes/swollen on top of foot - hurts when she walks. Patient had bilateral bunion surgery years ago. Wanted to have it looked at before it gets worse    71 y.o. female presents with the above complaint. History confirmed with patient. ***  Objective:  Physical Exam: {pe foot exam:315761::"warm, good capillary refill","normal sensory exam", "no trophic changes or ulcerative lesions","normal DP and PT pulses"}. Left Foot: {amfootexam:25810} Right Foot: {amfootexam:25810}  No images are attached to the encounter.  Radiographs: Multiple views x-ray of {right/left foot:16097}: {MP Foot XR:23762::"no fracture, dislocation, swelling or degenerative changes noted"} Assessment:   1. Avascular necrosis of metatarsal bone (HCC)   2. Capsulitis of right foot      Plan:  Patient was evaluated and treated and all questions answered.  {Pod Diagnoses:23508} {Pod Plans:23486}  Return for after MRI to review.

## 2022-05-03 ENCOUNTER — Encounter: Payer: Self-pay | Admitting: Internal Medicine

## 2022-05-08 ENCOUNTER — Ambulatory Visit
Admission: RE | Admit: 2022-05-08 | Discharge: 2022-05-08 | Disposition: A | Discharge status: Home / Self Care | Payer: No Typology Code available for payment source | Source: Ambulatory Visit | Attending: Podiatry | Admitting: Podiatry

## 2022-05-08 DIAGNOSIS — M87076 Idiopathic aseptic necrosis of unspecified foot: Secondary | ICD-10-CM

## 2022-05-08 DIAGNOSIS — M778 Other enthesopathies, not elsewhere classified: Secondary | ICD-10-CM

## 2022-05-19 ENCOUNTER — Other Ambulatory Visit (INDEPENDENT_AMBULATORY_CARE_PROVIDER_SITE_OTHER): Payer: No Typology Code available for payment source

## 2022-05-19 DIAGNOSIS — Z23 Encounter for immunization: Secondary | ICD-10-CM | POA: Diagnosis not present

## 2022-05-25 ENCOUNTER — Ambulatory Visit (INDEPENDENT_AMBULATORY_CARE_PROVIDER_SITE_OTHER): Payer: No Typology Code available for payment source | Admitting: Podiatry

## 2022-05-25 DIAGNOSIS — G5761 Lesion of plantar nerve, right lower limb: Secondary | ICD-10-CM | POA: Diagnosis not present

## 2022-05-25 DIAGNOSIS — M7751 Other enthesopathy of right foot: Secondary | ICD-10-CM | POA: Diagnosis not present

## 2022-05-25 NOTE — Patient Instructions (Signed)
Metatarsal pads can be bought on [employer]

## 2022-05-27 NOTE — Progress Notes (Signed)
  Subjective:  Patient ID: Jennifer Whitehead, female    DOB: Jan 27, 1951,  MRN: [id number]  Chief Complaint  Patient presents with   consult    Follow up after MRI    71 y.o. female presents with the above complaint. History confirmed with patient.  She completed the MRI  Objective:  Physical Exam: warm, good capillary refill, no trophic changes or ulcerative lesions, normal DP and PT pulses, normal sensory exam, on exam today she has no tenderness in the plantar third interspace there is significant interspace pain in the second interspace dorsal and plantar   Radiographs: Multiple views x-ray of the right foot: Possible erosion of the right third metatarsal head    IMPRESSION: Plantar neuroma in the third webspace measuring 0.7 x 0.6 cm.   Mild intermetatarsal bursal fluid in the second web space.   Adventitial bursitis along the plantar aspect of the second metatarsal head measuring 3 mm short axis.   Postsurgical changes of first and fifth distal metatarsal osteotomies. No acute osseous abnormality.     Electronically Signed   By: Maurine Simmering M.D.   On: 05/09/2022 14:42 Assessment:   1. 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 Bursitis of intermetatarsal bursa of right foot      Plan:  Patient was evaluated and treated and all questions answered.  We reviewed her MRI findings.  Discussed there is no evidence of AVN or [place].  She does have intermetatarsal bursa which does appear to be inflamed based on my clinical exam, her area of the neuroma on her MRI is not symptomatic currently.  We discussed treatment options for these including injection therapy for both options.  Currently not particular painful.  I dispensed metatarsal pads to offload these lesions.  She will return as needed if it worsens and needs an injection  Return if symptoms worsen or fail to improve.

## 2022-06-06 ENCOUNTER — Encounter: Payer: Self-pay | Admitting: Internal Medicine

## 2022-06-19 ENCOUNTER — Encounter: Payer: Self-pay | Admitting: Internal Medicine

## 2022-07-28 ENCOUNTER — Ambulatory Visit (INDEPENDENT_AMBULATORY_CARE_PROVIDER_SITE_OTHER): Payer: No Typology Code available for payment source

## 2022-07-28 ENCOUNTER — Ambulatory Visit: Payer: No Typology Code available for payment source | Admitting: Surgical

## 2022-07-28 ENCOUNTER — Encounter: Payer: Self-pay | Admitting: Orthopedic Surgery

## 2022-07-28 VITALS — Ht 63.25 in | Wt 177.8 lb

## 2022-07-28 DIAGNOSIS — M1711 Unilateral primary osteoarthritis, right knee: Secondary | ICD-10-CM | POA: Diagnosis not present

## 2022-07-28 DIAGNOSIS — M25561 Pain in right knee: Secondary | ICD-10-CM | POA: Diagnosis not present

## 2022-07-28 MED ORDER — DICLOFENAC SODIUM 50 MG PO TBEC: 50.0000 mg | DELAYED_RELEASE_TABLET | Freq: Two times a day (BID) | ORAL | 0 refills | Status: DC

## 2022-07-28 NOTE — Progress Notes (Unsigned)
Office Visit Note   Patient: Jennifer Whitehead           Date of Birth: Jun 25, 1951           MRN: [id number] Visit Date: 07/28/2022 Requested by: Ronnald Nian, MD [address] [place],  [place] [address] PCP: Ronnald Nian, MD  Subjective: Chief Complaint  Patient presents with   Right Knee - Pain    HPI: Jennifer Whitehead is a 71 y.o. female who presents to the office reporting right knee pain.  Patient has history of right knee osteoarthritis.  She states that she has had acute problem with her right knee when a 60 pound dog ran into her right knee while she was standing just before Halloween.  She had difficulty weightbearing for a couple days but denies any swelling at the time of injury or any bruising.  Eventually she was able to weight-bear and she is walking around pretty much normal at this point.  She is not using any cane or walker for ambulation.  She has been using Tylenol and Voltaren gel with some relief of her pain.  Knee feels a little bit more unstable for her since the incident.  She has some difficulty with active extension of the right knee by her history since this event.  No new groin pain or other joint pain..                ROS: All systems reviewed are negative as they relate to the chief complaint within the history of present illness.  Patient denies fevers or chills.  Assessment & Plan: Visit Diagnoses:  1. Unilateral primary osteoarthritis, right knee   2. Acute pain of right knee     Plan: Patient is a 71 year old female who presents for evaluation of right knee pain.  She has had increased pain to the right knee since a dog ran into her knee.  She has moderate to severe degenerative changes that is worst in the patellofemoral compartment.  No evidence of ligamentous instability or new fracture based on exam and radiographs taken today.  We discussed options of elevation including physical therapy versus trying new anti-inflammatory medication versus  cortisone injection.  She would like to hold off on any injection for now and try switching from meloxicam to diclofenac.  Will see how this does for her and she will follow-up in 6 weeks for clinical recheck with consideration of injection at that time.  She had good function in her right knee prior to this event despite the severe arthritis so as long as she improves, do not anticipate immediate need for knee replacement.  Her left knee which has been replaced is doing well but she would like to hold off on having that procedure done as long as possible on the right.  Follow-Up Instructions: No follow-ups on file.   Orders:  Orders Placed This Encounter  Procedures   XR KNEE 3 VIEW RIGHT   Meds ordered this encounter  Medications   diclofenac (VOLTAREN) 50 MG EC tablet    Sig: Take 1 tablet (50 mg total) by mouth 2 (two) times daily.    Dispense:  60 tablet    Refill:  0      Procedures: No procedures performed   Clinical Data: No additional findings.  Objective: Vital Signs: Ht 5' 3.25" (1.607 m)   Wt 177 lb 12.8 oz (80.6 kg)   BMI 31.25 kg/m   Physical Exam:  Constitutional: Patient appears well-developed  HEENT:  Head: Normocephalic Eyes:EOM are normal Neck: Normal range of motion Cardiovascular: Normal rate Pulmonary/chest: Effort normal Neurologic: Patient is alert Skin: Skin is warm Psychiatric: Patient has normal mood and affect  Ortho Exam: Ortho exam demonstrates right knee with 3 degrees extension and 120 degrees of knee flexion.  She is able to actively extend the right knee to 3 degrees of extension with no extensor lag.  No calf tenderness.  Negative Homans' sign.  Stable to anterior and posterior drawer sign.  Stable to varus and valgus stress at 0 and 30 degrees.  She has tenderness over the medial and lateral joint lines.  Patellofemoral crepitus noted with passive motion and active motion of the right knee.  No pain with hip range of motion.  Specialty  Comments:  No specialty comments available.  Imaging: No results found.   PMFS History: Patient Active Problem List   Diagnosis Date Noted   Arthritis 08/16/2021   S/P TKR (total knee replacement)    At high risk for breast cancer 05/09/2016   Past Medical History:  Diagnosis Date   Anxiety    Arthritis    in both knees   Broken arm    left   Deep vein blood clot of left lower extremity (HCC)    left leg    Family History  Problem Relation Age of Onset   Alcohol abuse Father     Past Surgical History:  Procedure Laterality Date   ANTERIOR CRUCIATE LIGAMENT REPAIR Left    BUNIONECTOMY Bilateral    dental bone grafts     HARDWARE REMOVAL Left 09/24/2018   Procedure: Hardware Removal Left Knee;  Surgeon: Cammy Copa, MD;  Location: [facility] OR;  Service: Orthopedics;  Laterality: Left;   RADIAL HEAD EXCISION Left    TOTAL KNEE ARTHROPLASTY Left 09/24/2018   Procedure: LEFT TOTAL KNEE ARTHROPLASTY;  Surgeon: Cammy Copa, MD;  Location: [facility] OR;  Service: Orthopedics;  Laterality: Left;   Social History   Occupational History   Not on file  Tobacco Use   Smoking status: Former    Packs/day: 0.25    Years: 4.00    Total pack years: 1.00    Types: Cigarettes    Quit date: 09/16/1976    Years since quitting: 45.8   Smokeless tobacco: Never  Substance and Sexual Activity   Alcohol use: Yes    Comment: once a week   Drug use: Never   Sexual activity: Yes    Partners: Male    Birth control/protection: Post-menopausal

## 2022-07-29 ENCOUNTER — Encounter: Payer: Self-pay | Admitting: Orthopedic Surgery

## 2022-08-25 ENCOUNTER — Ambulatory Visit (INDEPENDENT_AMBULATORY_CARE_PROVIDER_SITE_OTHER): Payer: No Typology Code available for payment source | Admitting: Family Medicine

## 2022-08-25 ENCOUNTER — Encounter: Payer: Self-pay | Admitting: Family Medicine

## 2022-08-25 VITALS — BP 122/74 | HR 82 | Ht 63.25 in | Wt 178.0 lb

## 2022-08-25 DIAGNOSIS — Z13220 Encounter for screening for lipoid disorders: Secondary | ICD-10-CM | POA: Diagnosis not present

## 2022-08-25 DIAGNOSIS — Z9189 Other specified personal risk factors, not elsewhere classified: Secondary | ICD-10-CM | POA: Diagnosis not present

## 2022-08-25 DIAGNOSIS — M1990 Unspecified osteoarthritis, unspecified site: Secondary | ICD-10-CM

## 2022-08-25 DIAGNOSIS — G479 Sleep disorder, unspecified: Secondary | ICD-10-CM

## 2022-08-25 DIAGNOSIS — Z0000 Encounter for general adult medical examination without abnormal findings: Secondary | ICD-10-CM | POA: Diagnosis not present

## 2022-08-25 DIAGNOSIS — Z1211 Encounter for screening for malignant neoplasm of colon: Secondary | ICD-10-CM

## 2022-08-25 DIAGNOSIS — Z96652 Presence of left artificial knee joint: Secondary | ICD-10-CM | POA: Diagnosis not present

## 2022-08-25 MED ORDER — MELOXICAM 15 MG PO TABS: 15.0000 mg | ORAL_TABLET | Freq: Every day | ORAL | 1 refills | Status: DC

## 2022-08-25 MED ORDER — TRAZODONE HCL 50 MG PO TABS: 25.0000 mg | ORAL_TABLET | Freq: Every evening | ORAL | 1 refills | Status: DC | PRN

## 2022-08-25 NOTE — Progress Notes (Signed)
Jennifer Whitehead is a 71 y.o. female who presents for annual wellness visit and follow-up on chronic medical conditions.  She has arthritis and does use meloxicam with good results.  She usually can take half a pill with good results.  She has been using Whitehead in the past to help with sleep and only uses it a couple times per week.  She apparently started on this when she was going through her divorce.  She is no longer on any hormone replacement.  She gets regular mammograms and will also be in need of another Cologuard.  She has had her left knee replaced but seems to doing well with that.  Otherwise she has no particular concerns or complaints. Immunizations and Health Maintenance Immunization History  Administered Date(s) Administered   Fluad Quad(high Dose 65+) 05/13/2019, 05/19/2022   Influenza, High Dose Seasonal PF 05/28/2017, 06/05/2018   Influenza-Unspecified 06/01/2021   PFIZER(Purple Top)SARS-COV-2 Vaccination 10/03/2019, 10/24/2019   Pneumococcal Conjugate-13 05/28/2017   Pneumococcal Polysaccharide-23 06/05/2018   Tdap 05/13/2019   Zoster Recombinat (Shingrix) 05/13/2019, 08/05/2019   Health Maintenance Due  Topic Date Due   Fecal DNA (Cologuard)  05/20/2022    Last Pap smear:N/A Last mammogram: 10/25/2021 Last colonoscopy: cologuard Last DEXA: 02/20/2022 Dentist: within the last year Ophtho: more than 1 year ago Exercise: weight lifting and cardio 3 times a week  Other doctors caring for patient include:none  Advanced directives:none    Depression screen:  See questionnaire below.     08/25/2022    1:36 PM 08/16/2021    8:36 AM 08/09/2020    8:25 AM  Depression screen PHQ 2/9  Decreased Interest 0 0 0  Down, Depressed, Hopeless 0 0 0  PHQ - 2 Score 0 0 0    Fall Risk Screen: see questionnaire below.    08/25/2022    1:36 PM 08/16/2021    8:36 AM 08/09/2020    8:25 AM  Fall Risk   Falls in the past year? 0 0 0  Number falls in past yr: 0 0   Injury  with Fall? 0 0   Risk for fall due to : No Fall Risks No Fall Risks No Fall Risks  Follow up Falls evaluation completed Falls evaluation completed     ADL screen:  See questionnaire below Functional Status Survey: Is the patient deaf or have difficulty hearing?: No Does the patient have difficulty seeing, even when wearing glasses/contacts?: No Does the patient have difficulty concentrating, remembering, or making decisions?: No Does the patient have difficulty walking or climbing stairs?: No Does the patient have difficulty dressing or bathing?: No Does the patient have difficulty doing errands alone such as visiting a doctor's office or shopping?: No   Review of Systems Constitutional: -, -unexpected weight change, -anorexia, -fatigue Allergy: -sneezing, -itching, -congestion Dermatology: denies changing moles, rash, lumps ENT: -runny nose, -ear pain, -sore throat,  Cardiology:  -chest pain, -palpitations, -orthopnea, Respiratory: -cough, -shortness of breath, -dyspnea on exertion, -wheezing,  Gastroenterology: -abdominal pain, -nausea, -vomiting, -diarrhea, -constipation, -dysphagia Hematology: -bleeding or bruising problems Musculoskeletal: -arthralgias, -myalgias, -joint swelling, -back pain, - Ophthalmology: -vision changes,  Urology: -dysuria, -difficulty urinating,  -urinary frequency, -urgency, incontinence Neurology: -, -numbness, , -memory loss, -falls, -dizziness    PHYSICAL EXAM:  BP 122/74   Pulse 82   Ht 5' 3.25" (1.607 m)   Wt 178 lb (80.7 kg)   SpO2 98%   BMI 31.28 kg/m   General Appearance: Alert, cooperative, no distress, appears stated age Head:  Normocephalic, without obvious abnormality, atraumatic Eyes: PERRL, conjunctiva/corneas clear, EOM's intact,  Ears: Normal TM's and external ear canals Nose: Nares normal, mucosa normal, no drainage or sinus tenderness Throat: Lips, mucosa, and tongue normal; teeth and gums normal Neck: Supple, no  lymphadenopathy;  thyroid:  no enlargement/tenderness/nodules; no carotid bruit or JVD Lungs: Clear to auscultation bilaterally without wheezes, rales or ronchi; respirations unlabored Heart: Regular rate and rhythm, S1 and S2 normal, no murmur, rubor gallop Abdomen: Soft, non-tender, nondistended, normoactive bowel sounds,  no masses, no hepatosplenomegaly Extremities: No clubbing, cyanosis or edema Pulses: 2+ and symmetric all extremities Skin:  Skin color, texture, turgor normal, no rashes or lesions Lymph nodes: Cervical, supraclavicular, and axillary nodes normal Neurologic:  CNII-XII intact, normal strength, sensation and gait; reflexes 2+ and symmetric throughout Psych: Normal mood, affect, hygiene and grooming.  ASSESSMENT/PLAN: Routine general medical examination at a health care facility - Plan: CBC with Differential/Platelet, Comprehensive metabolic panel, Lipid panel  Arthritis - Plan: CBC with Differential/Platelet, Comprehensive metabolic panel, meloxicam (MOBIC) 15 MG tablet  At high risk for breast cancer - Plan: CBC with Differential/Platelet, Comprehensive metabolic panel  Status post total left knee replacement  Screening for lipid disorders - Plan: Lipid panel  Screening for colon cancer - Plan: CBC with Differential/Platelet, Comprehensive metabolic panel, Cologuard  Sleep disturbance - Plan: traZODone (DESYREL) 50 MG tablet  I will switch her to trazodone Jennifer Whitehead to help with sleep disturbance.  She will continue on her meloxicam.  Encouraged her to remain physically active.  Discussed  yearly mammograms;   Immunization recommendations discussed.  Colonoscopy recommendations reviewed   Medicare Attestation I have personally reviewed: The patient's medical and social history Their use of alcohol, tobacco or illicit drugs Their current medications and supplements The patient's functional ability including ADLs,fall risks, home safety risks, cognitive,  and hearing and visual impairment Diet and physical activities Evidence for depression or mood disorders  The patient's weight, height, and BMI have been recorded in the chart.  I have made referrals, counseling, and provided education to the patient based on review of the above and I have provided the patient with a written personalized care plan for preventive services.     Sharlot Gowda, MD   08/25/2022

## 2022-08-26 LAB — CBC WITH DIFFERENTIAL/PLATELET
Basophils Absolute: 0.1 10*3/uL (ref 0.0–0.2)
Basos: 1 %
EOS (ABSOLUTE): 0.2 10*3/uL (ref 0.0–0.4)
Eos: 3 %
Hematocrit: 40.8 % (ref 34.0–46.6)
Hemoglobin: 13.6 g/dL (ref 11.1–15.9)
Immature Grans (Abs): 0.1 10*3/uL (ref 0.0–0.1)
Immature Granulocytes: 1 %
Lymphocytes Absolute: 2.5 10*3/uL (ref 0.7–3.1)
Lymphs: 31 %
MCH: 30.4 pg (ref 26.6–33.0)
MCHC: 33.3 g/dL (ref 31.5–35.7)
MCV: 91 fL (ref 79–97)
Monocytes Absolute: 0.7 10*3/uL (ref 0.1–0.9)
Monocytes: 8 %
Neutrophils Absolute: 4.6 10*3/uL (ref 1.4–7.0)
Neutrophils: 56 %
Platelets: 346 10*3/uL (ref 150–450)
RBC: 4.48 x10E6/uL (ref 3.77–5.28)
RDW: 12.3 % (ref 11.7–15.4)
WBC: 8.1 10*3/uL (ref 3.4–10.8)

## 2022-08-26 LAB — COMPREHENSIVE METABOLIC PANEL
ALT: 26 IU/L (ref 0–32)
AST: 24 IU/L (ref 0–40)
Albumin/Globulin Ratio: 1.8 (ref 1.2–2.2)
Albumin: 4.6 g/dL (ref 3.8–4.8)
Alkaline Phosphatase: 51 IU/L (ref 44–121)
BUN/Creatinine Ratio: 24 (ref 12–28)
BUN: 16 mg/dL (ref 8–27)
Bilirubin Total: 0.4 mg/dL (ref 0.0–1.2)
CO2: 23 mmol/L (ref 20–29)
Calcium: 10 mg/dL (ref 8.7–10.3)
Chloride: 99 mmol/L (ref 96–106)
Creatinine, Ser: 0.67 mg/dL (ref 0.57–1.00)
Globulin, Total: 2.5 g/dL (ref 1.5–4.5)
Glucose: 90 mg/dL (ref 70–99)
Potassium: 4.8 mmol/L (ref 3.5–5.2)
Sodium: 137 mmol/L (ref 134–144)
Total Protein: 7.1 g/dL (ref 6.0–8.5)
eGFR: 93 mL/min/{1.73_m2} (ref >59)

## 2022-08-26 LAB — LIPID PANEL
Chol/HDL Ratio: 3.6 ratio (ref 0.0–4.4)
Cholesterol, Total: 243 mg/dL — ABNORMAL HIGH (ref 100–199)
HDL: 68 mg/dL (ref >39)
LDL Chol Calc (NIH): 143 mg/dL — ABNORMAL HIGH (ref 0–99)
Triglycerides: 183 mg/dL — ABNORMAL HIGH (ref 0–149)
VLDL Cholesterol Cal: 32 mg/dL (ref 5–40)

## 2022-09-08 ENCOUNTER — Encounter: Payer: Self-pay | Admitting: Orthopedic Surgery

## 2022-09-08 ENCOUNTER — Ambulatory Visit: Payer: No Typology Code available for payment source | Admitting: Orthopedic Surgery

## 2022-09-08 DIAGNOSIS — M25561 Pain in right knee: Secondary | ICD-10-CM | POA: Diagnosis not present

## 2022-09-08 DIAGNOSIS — M1711 Unilateral primary osteoarthritis, right knee: Secondary | ICD-10-CM | POA: Diagnosis not present

## 2022-09-08 NOTE — Progress Notes (Signed)
Office Visit Note   Patient: Jennifer Whitehead           Date of Birth: 07-21-1951           MRN: [id number] Visit Date: 09/08/2022 Requested by: Denita Lung, MD [address] [place],  [place] [address] PCP: Denita Lung, MD  Subjective: Chief Complaint  Patient presents with   Right Knee - Follow-up    HPI: Jennifer Whitehead is a 72 y.o. female who presents to the office reporting right knee pain.  Decision today was for or against an injection.  Patient really does not want an injection.  Overall her knee is some better.  We talked a lot about exercises to do.  She did have a hyperextension injury around Halloween but that has improved.  She is doing 3 times a week of working out and also [employer]ch[employer], retail..                ROS: All systems reviewed are negative as they relate to the chief complaint within the history of present illness.  Patient denies fevers or chills.  Assessment & Plan: Visit Diagnoses:  1. Acute pain of right knee   2. Unilateral primary osteoarthritis, right knee     Plan: Impression is right knee arthritis which is moderate at this time clinically.  Overall it is improved from Halloween.  No indication for intervention at this time.  Follow-up as needed.  Follow-Up Instructions: No follow-ups on file.   Orders:  No orders of the defined types were placed in this encounter.  No orders of the defined types were placed in this encounter.     Procedures: No procedures performed   Clinical Data: No additional findings.  Objective: Vital Signs: There were no vitals taken for this visit.  Physical Exam:  Constitutional: Patient appears well-developed HEENT:  Head: Normocephalic Eyes:EOM are normal Neck: Normal range of motion Cardiovascular: Normal rate Pulmonary/chest: Effort normal Neurologic: Patient is alert Skin: Skin is warm Psychiatric: Patient has normal mood and affect  Ortho Exam: Ortho exam demonstrates full active and  passive range of motion of the hips and ankles.  She has excellent range of motion of the right knee from 0-1 20.  No effusion.  Extensor mechanism intact.  Collateral and cruciate ligaments are stable.  Specialty Comments:  No specialty comments available.  Imaging: No results found.   PMFS History: Patient Active Problem List   Diagnosis Date Noted   Arthritis 08/16/2021   S/P TKR (total knee replacement)    At high risk for breast cancer 05/09/2016   Past Medical History:  Diagnosis Date   Anxiety    Arthritis    in both knees   Broken arm    left   Deep vein blood clot of left lower extremity (HCC)    left leg    Family History  Problem Relation Age of Onset   Alcohol abuse Father     Past Surgical History:  Procedure Laterality Date   ANTERIOR CRUCIATE LIGAMENT REPAIR Left    BUNIONECTOMY Bilateral    dental bone grafts     HARDWARE REMOVAL Left 09/24/2018   Procedure: Hardware Removal Left Knee;  Surgeon: Meredith Pel, MD;  Location: [facility];  Service: Orthopedics;  Laterality: Left;   RADIAL HEAD EXCISION Left    TOTAL KNEE ARTHROPLASTY Left 09/24/2018   Procedure: LEFT TOTAL KNEE ARTHROPLASTY;  Surgeon: Meredith Pel, MD;  Location: [facility];  Service: Orthopedics;  Laterality: Left;   Social History   Occupational History   Not on file  Tobacco Use   Smoking status: Former    Packs/day: 0.25    Years: 4.00    Total pack years: 1.00    Types: Cigarettes    Quit date: 09/16/1976    Years since quitting: 46.0   Smokeless tobacco: Never  Substance and Sexual Activity   Alcohol use: Yes    Comment: once a week   Drug use: Never   Sexual activity: Yes    Partners: Male    Birth control/protection: Post-menopausal

## 2022-09-17 ENCOUNTER — Other Ambulatory Visit: Payer: Self-pay | Admitting: Family Medicine

## 2022-09-17 DIAGNOSIS — G479 Sleep disorder, unspecified: Secondary | ICD-10-CM

## 2022-09-18 NOTE — Telephone Encounter (Signed)
Patient is requesting a 90 day supply instead of 30 day supply. Is it ok to refill?

## 2022-09-20 LAB — COLOGUARD: COLOGUARD: NEGATIVE

## 2022-12-17 ENCOUNTER — Other Ambulatory Visit: Payer: Self-pay | Admitting: Family Medicine

## 2022-12-17 DIAGNOSIS — G479 Sleep disorder, unspecified: Secondary | ICD-10-CM

## 2022-12-18 NOTE — Telephone Encounter (Signed)
Refill request last apt 08/25/22 next apt 09/18/23.

## 2023-01-01 ENCOUNTER — Other Ambulatory Visit (INDEPENDENT_AMBULATORY_CARE_PROVIDER_SITE_OTHER): Payer: No Typology Code available for payment source

## 2023-01-01 ENCOUNTER — Ambulatory Visit: Payer: No Typology Code available for payment source | Admitting: Orthopedic Surgery

## 2023-01-01 DIAGNOSIS — M1711 Unilateral primary osteoarthritis, right knee: Secondary | ICD-10-CM | POA: Diagnosis not present

## 2023-01-02 ENCOUNTER — Encounter: Payer: Self-pay | Admitting: Orthopedic Surgery

## 2023-01-02 NOTE — Progress Notes (Signed)
Office Visit Note   Patient: Jennifer Whitehead           Date of Birth: 01-18-1951           MRN: [id number] Visit Date: 01/01/2023 Requested by: Ronnald Nian, MD [address] [place],  [place] [address] PCP: Ronnald Nian, MD  Subjective: Chief Complaint  Patient presents with   Right Knee - Pain    HPI: Jennifer Whitehead is a 72 y.o. female who presents to the office reporting right knee pain.  She fell 2 months ago.  I reviewed that fall on a video.  This was a twisting type injury with impact.  She does describe weakness and giving way and that right leg.  She is doing well with her left total knee replacement.  She does report in that right knee some popping and catching.  Pain increases with activities.  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  That definitely aggravated the knee after the fall.  Reports primarily posterior pain.  The pain does wake her from sleep at night..                ROS: All systems reviewed are negative as they relate to the chief complaint within the history of present illness.  Patient denies fevers or chills.  Assessment & Plan: Visit Diagnoses:  1. Unilateral primary osteoarthritis, right knee     Plan: Impression is right knee pain with possible meniscal pathology.  No fracture on plain radiographs.  Mild degenerative changes present.  Joint space maintained.  Symptoms ongoing now for longer than 8 weeks with good mechanism of injury for possible meniscal pathology.  Follow-up after MRI scan.  Follow-Up Instructions: No follow-ups on file.   Orders:  Orders Placed This Encounter  Procedures   XR KNEE 3 VIEW RIGHT   MR Knee Right w/o contrast   No orders of the defined types were placed in this encounter.     Procedures: No procedures performed   Clinical Data: No additional findings.  Objective: Vital Signs: There were no vitals taken for this visit.  Physical Exam:  Constitutional: Patient  appears well-developed HEENT:  Head: Normocephalic Eyes:EOM are normal Neck: Normal range of motion Cardiovascular: Normal rate Pulmonary/chest: Effort normal Neurologic: Patient is alert Skin: Skin is warm Psychiatric: Patient has normal mood and affect  Ortho Exam: Ortho exam demonstrates trace effusion in the right knee.  Collateral and cruciate ligaments are stable.  Patient has both medial and lateral joint line tenderness with positive Murray compression testing.  Range of motion is full.  No groin pain on the right with internal/external rotation of the leg.  No other masses lymphadenopathy or skin changes noted in that right leg region.  Specialty Comments:  No specialty comments available.  Imaging: XR KNEE 3 VIEW RIGHT  Result Date: 01/02/2023 AP lateral merchant radiographs right knee reviewed.  Mild varus alignment present.  Medial joint space narrowing is present but no bone-on-bone changes.  No loose bodies.  No acute fracture.    PMFS History: Patient Active Problem List   Diagnosis Date Noted   Arthritis 08/16/2021   S/P TKR (total knee replacement)    At high risk for breast cancer 05/09/2016   Past Medical History:  Diagnosis Date   Anxiety    Arthritis    in both knees   Broken arm    left   Deep vein blood clot of left lower extremity (  HCC)    left leg    Family History  Problem Relation Age of Onset   Alcohol abuse Father     Past Surgical History:  Procedure Laterality Date   ANTERIOR CRUCIATE LIGAMENT REPAIR Left    BUNIONECTOMY Bilateral    dental bone grafts     HARDWARE REMOVAL Left 09/24/2018   Procedure: Hardware Removal Left Knee;  Surgeon: Cammy Copa, MD;  Location: [facility] OR;  Service: Orthopedics;  Laterality: Left;   RADIAL HEAD EXCISION Left    TOTAL KNEE ARTHROPLASTY Left 09/24/2018   Procedure: LEFT TOTAL KNEE ARTHROPLASTY;  Surgeon: Cammy Copa, MD;  Location: [facility] OR;  Service: Orthopedics;  Laterality: Left;   Social  History   Occupational History   Not on file  Tobacco Use   Smoking status: Former    Packs/day: 0.25    Years: 4.00    Additional pack years: 0.00    Total pack years: 1.00    Types: Cigarettes    Quit date: 09/16/1976    Years since quitting: 46.3   Smokeless tobacco: Never  Substance and Sexual Activity   Alcohol use: Yes    Comment: once a week   Drug use: Never   Sexual activity: Yes    Partners: Male    Birth control/protection: Post-menopausal

## 2023-01-04 ENCOUNTER — Other Ambulatory Visit: Payer: Self-pay | Admitting: Family Medicine

## 2023-01-04 DIAGNOSIS — Z0000 Encounter for general adult medical examination without abnormal findings: Secondary | ICD-10-CM

## 2023-01-07 ENCOUNTER — Ambulatory Visit
Admission: RE | Admit: 2023-01-07 | Discharge: 2023-01-07 | Disposition: A | Discharge status: Home / Self Care | Payer: No Typology Code available for payment source | Source: Ambulatory Visit | Attending: Orthopedic Surgery | Admitting: Orthopedic Surgery

## 2023-01-07 DIAGNOSIS — M1711 Unilateral primary osteoarthritis, right knee: Secondary | ICD-10-CM

## 2023-01-08 ENCOUNTER — Ambulatory Visit
Admission: RE | Admit: 2023-01-08 | Discharge: 2023-01-08 | Disposition: A | Discharge status: Home / Self Care | Payer: No Typology Code available for payment source | Source: Ambulatory Visit | Attending: Family Medicine | Admitting: Family Medicine

## 2023-01-08 DIAGNOSIS — Z0000 Encounter for general adult medical examination without abnormal findings: Secondary | ICD-10-CM

## 2023-01-11 ENCOUNTER — Encounter: Payer: Self-pay | Admitting: Orthopedic Surgery

## 2023-01-11 ENCOUNTER — Ambulatory Visit: Payer: No Typology Code available for payment source | Admitting: Orthopedic Surgery

## 2023-01-11 DIAGNOSIS — M1711 Unilateral primary osteoarthritis, right knee: Secondary | ICD-10-CM | POA: Diagnosis not present

## 2023-01-11 NOTE — Progress Notes (Signed)
Office Visit Note   Patient: Jennifer Whitehead           Date of Birth: 1950/11/12           MRN: [id number] Visit Date: 01/11/2023 Requested by: Ronnald Nian, MD [address] [place],  [place] [address] PCP: Ronnald Nian, MD  Subjective: Chief Complaint  Patient presents with   Other     Scan review    HPI: Jennifer Whitehead is a 72 y.o. female who presents to the office reporting right knee pain.  Since her last office visit she has had an MRI scan.  MRI scan is reviewed.  Shows some meniscal pathology on the medial side.  Collateral crucial ligaments are stable.  Severe patellofemoral arthritis is present with less severe degenerative changes in the medial compartment.  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                ROS: All systems reviewed are negative as they relate to the chief complaint within the history of present illness.  Patient denies fevers or chills.  Assessment & Plan: Visit Diagnoses:  1. Unilateral primary osteoarthritis, right knee     Plan: Impression is right knee pain following impact injury several weeks ago.  No real structural problem is present.  The meniscal tear looks more degenerative and does not appear to be unstable.  No effusion in the knee joint today.  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  Follow-up as needed.  Follow-Up Instructions: No follow-ups on file.   Orders:  No orders of the defined types were placed in this encounter.  No orders of the defined types were placed in this encounter.     Procedures: No procedures performed   Clinical Data: No additional findings.  Objective: Vital Signs: There were no vitals taken for this visit.  Physical Exam:  Constitutional: Patient appears well-developed HEENT:  Head: Normocephalic Eyes:EOM are normal Neck: Normal range of motion Cardiovascular: Normal  rate Pulmonary/chest: Effort normal Neurologic: Patient is alert Skin: Skin is warm Psychiatric: Patient has normal mood and affect  Ortho Exam: Ortho exam demonstrates no right knee effusion.  Lacks about 5 degrees of full extension.  Flexion is intact.  Collateral crucial ligaments are stable.  Patellofemoral crepitus is present.  No real medial or lateral joint line tenderness.  Specialty Comments:  No specialty comments available.  Imaging: No results found.   PMFS History: Patient Active Problem List   Diagnosis Date Noted   Arthritis 08/16/2021   S/P TKR (total knee replacement)    At high risk for breast cancer 05/09/2016   Past Medical History:  Diagnosis Date   Anxiety    Arthritis    in both knees   Broken arm    left   Deep vein blood clot of left lower extremity (HCC)    left leg    Family History  Problem Relation Age of Onset   Alcohol abuse Father     Past Surgical History:  Procedure Laterality Date   ANTERIOR CRUCIATE LIGAMENT REPAIR Left    BUNIONECTOMY Bilateral    dental bone grafts     HARDWARE REMOVAL Left 09/24/2018   Procedure: Hardware Removal Left Knee;  Surgeon: Cammy Copa, MD;  Location: [facility] OR;  Service: Orthopedics;  Laterality: Left;   RADIAL HEAD EXCISION Left    TOTAL  KNEE ARTHROPLASTY Left 09/24/2018   Procedure: LEFT TOTAL KNEE ARTHROPLASTY;  Surgeon: Cammy Copa, MD;  Location: [facility] OR;  Service: Orthopedics;  Laterality: Left;   Social History   Occupational History   Not on file  Tobacco Use   Smoking status: Former    Packs/day: 0.25    Years: 4.00    Additional pack years: 0.00    Total pack years: 1.00    Types: Cigarettes    Quit date: 09/16/1976    Years since quitting: 46.3   Smokeless tobacco: Never  Substance and Sexual Activity   Alcohol use: Yes    Comment: once a week   Drug use: Never   Sexual activity: Yes    Partners: Male    Birth control/protection: Post-menopausal

## 2023-03-14 ENCOUNTER — Other Ambulatory Visit: Payer: Self-pay | Admitting: Family Medicine

## 2023-03-14 DIAGNOSIS — M1990 Unspecified osteoarthritis, unspecified site: Secondary | ICD-10-CM

## 2023-03-14 DIAGNOSIS — G479 Sleep disorder, unspecified: Secondary | ICD-10-CM

## 2023-03-19 ENCOUNTER — Other Ambulatory Visit: Payer: Self-pay | Admitting: Family Medicine

## 2023-03-19 DIAGNOSIS — G479 Sleep disorder, unspecified: Secondary | ICD-10-CM

## 2023-03-28 ENCOUNTER — Ambulatory Visit (INDEPENDENT_AMBULATORY_CARE_PROVIDER_SITE_OTHER): Payer: No Typology Code available for payment source | Admitting: Radiology

## 2023-03-28 ENCOUNTER — Encounter: Payer: Self-pay | Admitting: Radiology

## 2023-03-28 ENCOUNTER — Other Ambulatory Visit (HOSPITAL_COMMUNITY)
Admission: RE | Admit: 2023-03-28 | Discharge: 2023-03-28 | Disposition: A | Discharge status: Home / Self Care | Payer: No Typology Code available for payment source | Source: Ambulatory Visit | Attending: Radiology | Admitting: Radiology

## 2023-03-28 VITALS — BP 106/72 | Ht 63.0 in | Wt 178.0 lb

## 2023-03-28 DIAGNOSIS — Z01419 Encounter for gynecological examination (general) (routine) without abnormal findings: Secondary | ICD-10-CM

## 2023-03-28 DIAGNOSIS — N952 Postmenopausal atrophic vaginitis: Secondary | ICD-10-CM

## 2023-03-28 MED ORDER — ESTRADIOL 10 MCG VA TABS: 1.0000 | ORAL_TABLET | VAGINAL | 4 refills | Status: DC

## 2023-03-28 NOTE — Progress Notes (Signed)
   Felicita Eastburn 1951/02/06 [id number]   History: Postmenopausal 72 y.o. presents for annual exam. No gyn concerns. Stopped systemic HRT last year, doing well. Elects for pap this year.   Gynecologic History Postmenopausal Last Pap: 2021. Results were: normal Last mammogram: 5/24. Results were: normal Last colonoscopy: 1/24 DEXA:6/23   Obstetric History OB History  Gravida Para Term Preterm AB Living  0 0 0 0 0 0  SAB IAB Ectopic Multiple Live Births  0 0 0 0 0     The following portions of the patient's history were reviewed and updated as appropriate: allergies, current medications, past family history, past medical history, past social history, past surgical history, and problem list.  Review of Systems Pertinent items noted in HPI and remainder of comprehensive ROS otherwise negative.  Past medical history, past surgical history, family history and social history were all reviewed and documented in the EPIC chart.  Exam:  Vitals:   03/28/23 1101  BP: 106/72  Weight: 178 lb (80.7 kg)  Height: 5\' 3"  (1.6 m)   Body mass index is 31.53 kg/m.  General appearance:  Normal Thyroid:  Symmetrical, normal in size, without palpable masses or nodularity. Respiratory  Auscultation:  Clear without wheezing or rhonchi Cardiovascular  Auscultation:  Regular rate, without rubs, murmurs or gallops  Edema/varicosities:  Not grossly evident Abdominal  Soft,nontender, without masses, guarding or rebound.  Liver/spleen:  No organomegaly noted  Hernia:  None appreciated  Skin  Inspection:  Grossly normal Breasts: Examined lying and sitting.   Right: Without masses, retractions, nipple discharge or axillary adenopathy.   Left: Without masses, retractions, nipple discharge or axillary adenopathy. Genitourinary   Inguinal/mons:  Normal without inguinal adenopathy  External genitalia:  Normal appearing vulva with no masses, tenderness, or lesions  BUS/Urethra/Skene's glands:   Normal  Vagina:  Normal appearing with normal color and discharge, no lesions. Atrophy: moderate   Cervix:  Normal appearing without discharge or lesions  Uterus:  Normal in size, shape and contour.  Midline and mobile, nontender  Adnexa/parametria:     Rt: Normal in size, without masses or tenderness.   Lt: Normal in size, without masses or tenderness.  Anus and perineum: Normal    Raynelle Fanning, CMA present for exam  Assessment/Plan:   1. Well woman exam with routine gynecological exam - Cytology - PAP( [place])  2. Postmenopausal atrophic vaginitis - Estradiol (VAGIFEM) 10 MCG TABS vaginal tablet; Place 1 tablet (10 mcg total) vaginally 2 (two) times a week.  Dispense: 24 tablet; Refill: 4    Discussed SBE, colonoscopy and DEXA screening as directed. Recommend of exercise weekly, including weight bearing exercise. Encouraged the use of seatbelts and sunscreen.  Return in 1 year for annual or sooner prn.  Arlie Solomons B WHNP-BC, 11:20 AM 03/28/2023

## 2023-03-30 ENCOUNTER — Other Ambulatory Visit: Payer: Self-pay

## 2023-03-30 DIAGNOSIS — B379 Candidiasis, unspecified: Secondary | ICD-10-CM

## 2023-03-30 MED ORDER — FLUCONAZOLE 150 MG PO TABS
150.0000 mg | ORAL_TABLET | Freq: Once | ORAL | 0 refills | Status: AC
Start: 2023-03-30 — End: 2023-03-30

## 2023-04-15 IMAGING — MG MM DIGITAL SCREENING BILAT W/ TOMO AND CAD
8 series · 8 of 24 positions shown · non-contrast
Comparison: Previous exam(s).

CLINICAL DATA: Screening.

EXAM:
DIGITAL SCREENING BILATERAL MAMMOGRAM WITH TOMOSYNTHESIS AND CAD
TECHNIQUE: Bilateral screening digital craniocaudal and mediolateral oblique
mammograms were obtained. Bilateral screening digital breast
tomosynthesis was performed. The images were evaluated with
computer-aided detection.

[L MLO synth-2D]
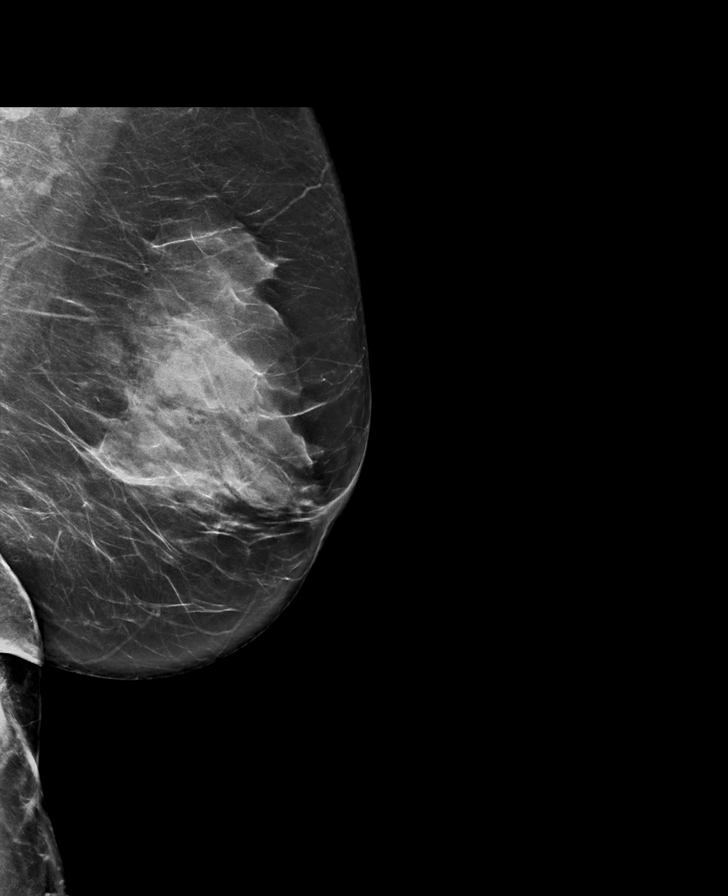

[R CC synth-2D]
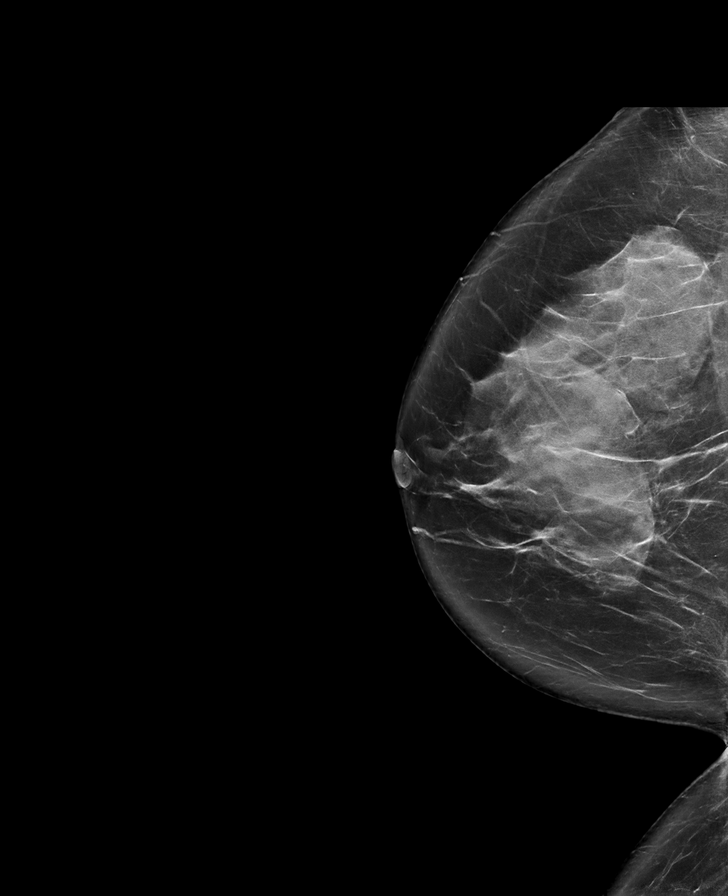

[L CC synth-2D]
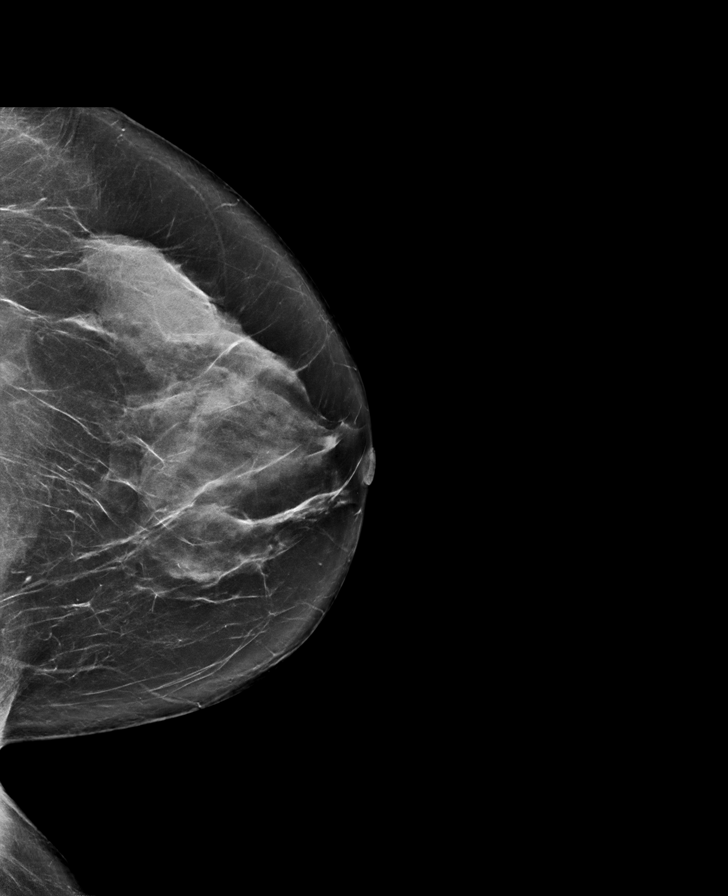

[R MLO synth-2D]
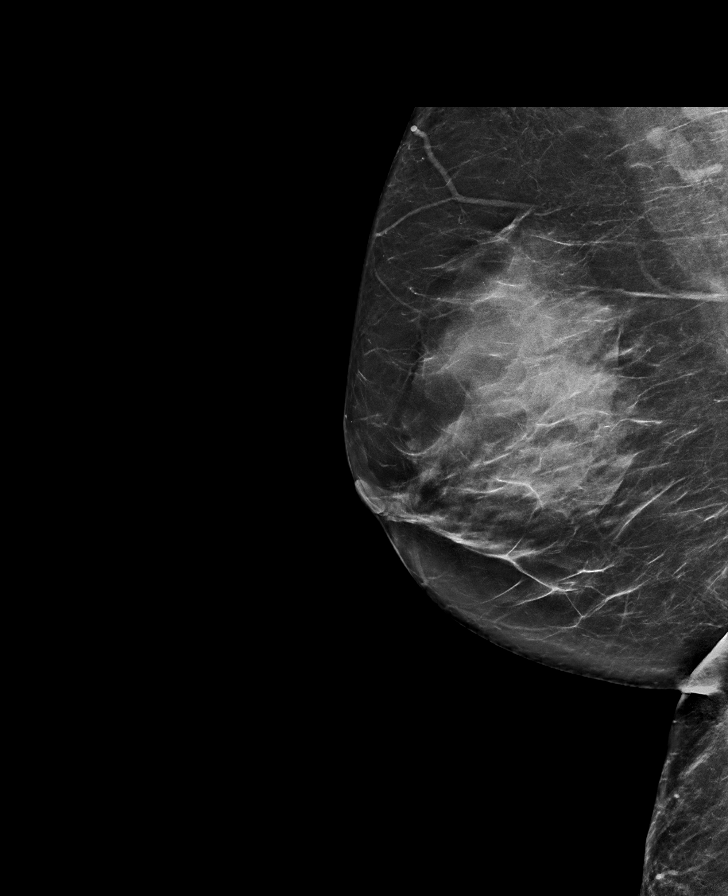

[L MLO tomo · tomo slice 41/81.0]
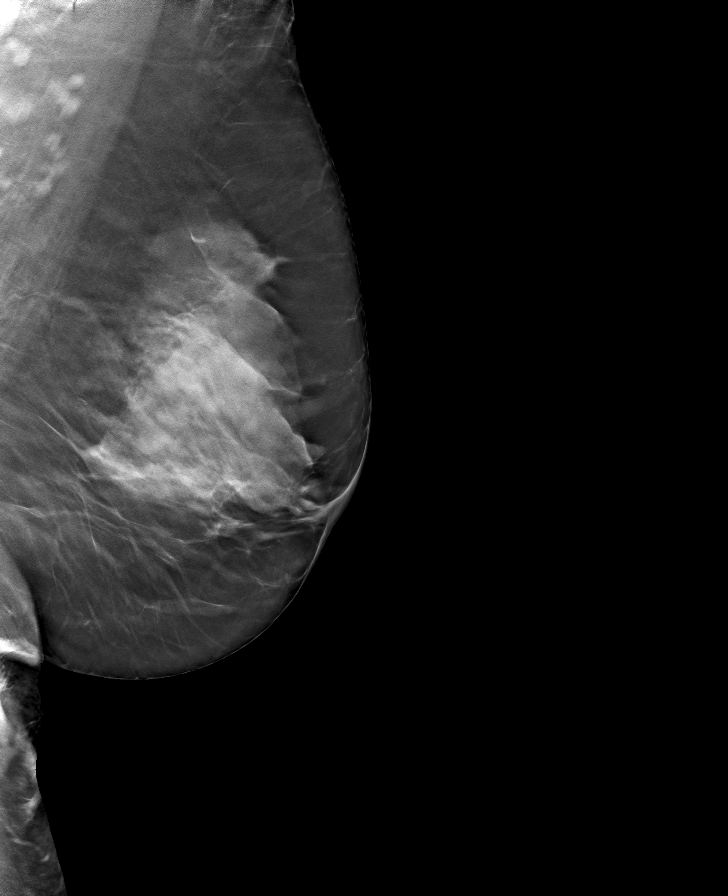

[R MLO tomo · tomo slice 39/77.0]
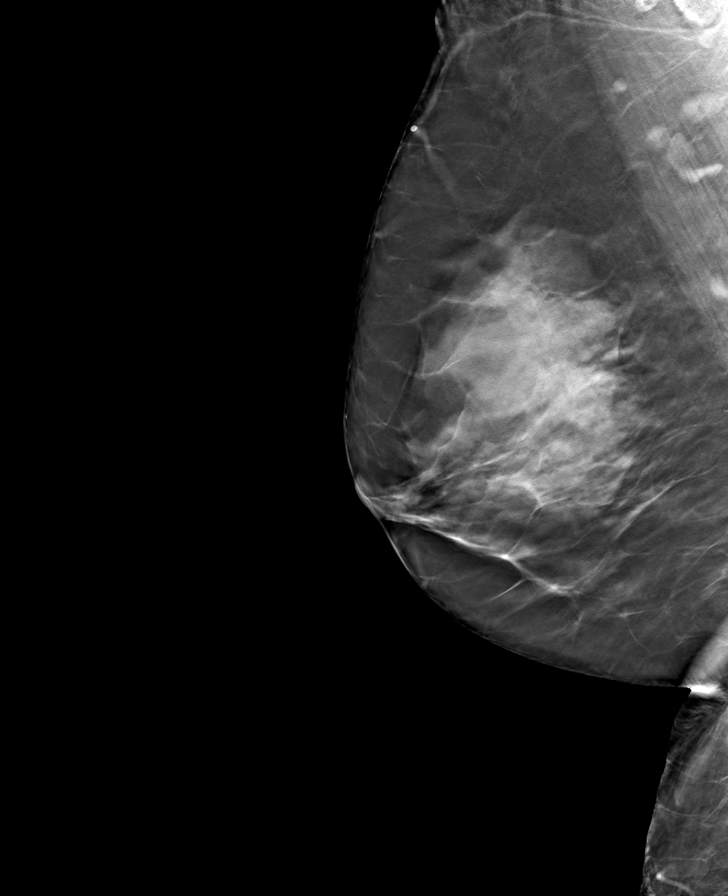

[R CC tomo · tomo slice 43/84.0]
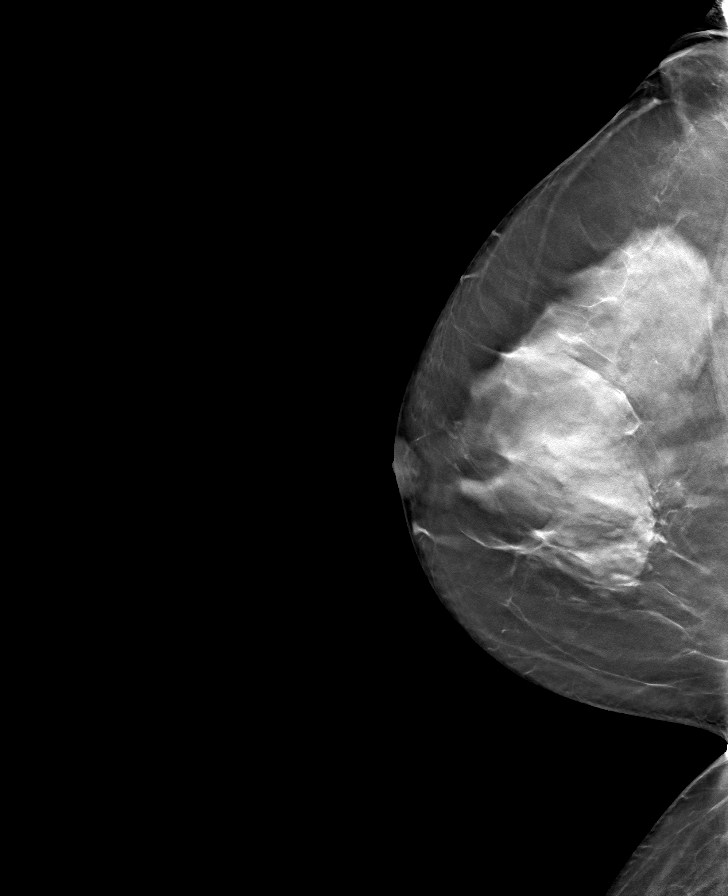

[L CC tomo · tomo slice 43/84.0]
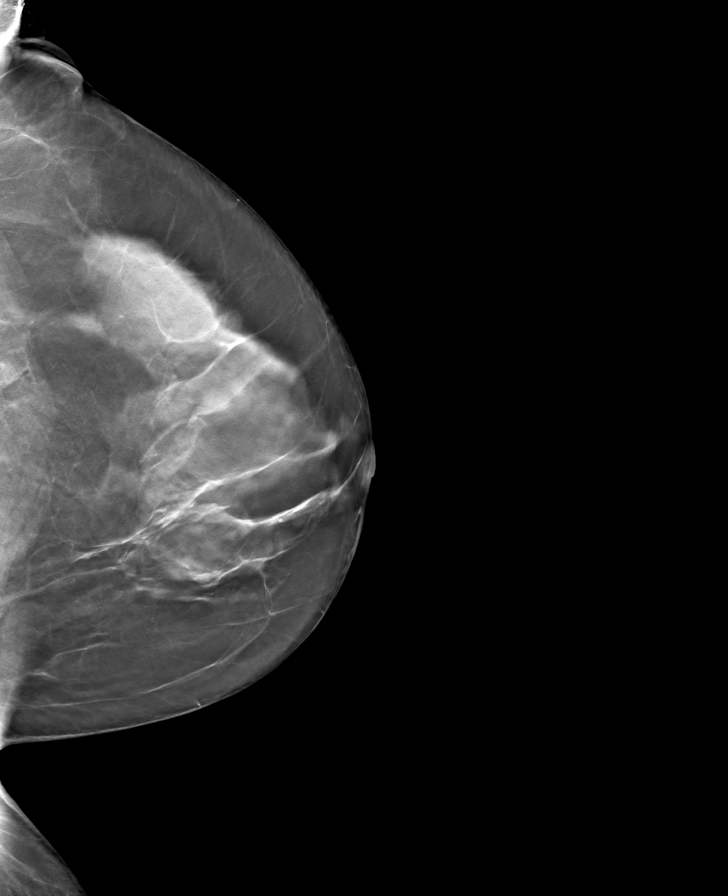

[8 of 24 positions shown; findings below may reference images not displayed]

ACR Breast Density Category c: The breast tissue is heterogeneously
dense, which may obscure small masses.
FINDINGS: There are no findings suspicious for malignancy.
IMPRESSION: No mammographic evidence of malignancy. A result letter of this
screening mammogram will be mailed directly to the patient.

RECOMMENDATION:
Screening mammogram in one year. (Code:Q3-W-BC3)

BI-RADS CATEGORY  1: Negative.

## 2023-05-18 ENCOUNTER — Telehealth: Payer: Self-pay

## 2023-05-18 DIAGNOSIS — B3731 Acute candidiasis of vulva and vagina: Secondary | ICD-10-CM

## 2023-05-18 MED ORDER — FLUCONAZOLE 150 MG PO TABS
150.0000 mg | ORAL_TABLET | Freq: Once | ORAL | 0 refills | Status: AC
Start: 2023-05-18 — End: 2023-05-18

## 2023-05-18 NOTE — Telephone Encounter (Signed)
Med refill request: fluconazole 150mg  Last AEX: 03/28/23 Next AEX: n/a Last MMG (if hormonal med) n/a Refill authorized: Please Advise?

## 2023-05-18 NOTE — Telephone Encounter (Signed)
Ok to send

## 2023-09-08 ENCOUNTER — Other Ambulatory Visit: Payer: Self-pay | Admitting: Family Medicine

## 2023-09-08 DIAGNOSIS — M1990 Unspecified osteoarthritis, unspecified site: Secondary | ICD-10-CM

## 2023-09-18 ENCOUNTER — Encounter: Payer: Self-pay | Admitting: Family Medicine

## 2023-09-18 ENCOUNTER — Ambulatory Visit (INDEPENDENT_AMBULATORY_CARE_PROVIDER_SITE_OTHER): Payer: No Typology Code available for payment source | Admitting: Family Medicine

## 2023-09-18 VITALS — BP 118/68 | HR 80 | Ht 63.0 in | Wt 180.4 lb

## 2023-09-18 DIAGNOSIS — M1990 Unspecified osteoarthritis, unspecified site: Secondary | ICD-10-CM

## 2023-09-18 DIAGNOSIS — Z23 Encounter for immunization: Secondary | ICD-10-CM

## 2023-09-18 DIAGNOSIS — Z13220 Encounter for screening for lipoid disorders: Secondary | ICD-10-CM

## 2023-09-18 DIAGNOSIS — Z96659 Presence of unspecified artificial knee joint: Secondary | ICD-10-CM

## 2023-09-18 DIAGNOSIS — Z0000 Encounter for general adult medical examination without abnormal findings: Secondary | ICD-10-CM | POA: Diagnosis not present

## 2023-09-18 LAB — LIPID PANEL

## 2023-09-18 NOTE — Addendum Note (Signed)
Addended by: Debbrah Alar F on: 09/18/2023 12:22 PM   Modules accepted: Orders

## 2023-09-18 NOTE — Progress Notes (Signed)
Complete physical exam  Patient: Jennifer Whitehead   DOB: 12-10-1950   73 y.o. Female  MRN: [id number]  Subjective:    Chief Complaint  Patient presents with   Annual Exam    Patient is here for a physical. She would like to know if you could add a an A1c to her labs. Asking about weightloss meds.     Jennifer Whitehead is a 73 y.o. female who presents today for a complete physical exam.  She reports consuming a general diet.  Goes to gym 3 times and week and walk 3 dogs daily.   She generally feels well. She reports sleeping well.  She has had a left TKR and does have occasional difficulty with her right knee but not to the point that OTC meds are taking care of her problem.  She has seen Dr. August Saucer in the past.  Most recent fall risk assessment:    09/18/2023    9:16 AM  Fall Risk   Falls in the past year? 0  Number falls in past yr: 0  Injury with Fall? 0  Risk for fall due to : No Fall Risks  Follow up Falls evaluation completed     Most recent depression screenings:    09/18/2023    9:16 AM 08/25/2022    1:36 PM  PHQ 2/9 Scores  PHQ - 2 Score 0 0    Vision:Within last year and Dental: No current dental problems and Last dental visit: in the last year    Immunization History  Administered Date(s) Administered   Fluad Quad(high Dose 65+) 05/13/2019, 05/19/2022   Influenza, High Dose Seasonal PF 05/28/2017, 06/05/2018   Influenza-Unspecified 06/01/2021, 05/22/2023   PFIZER(Purple Top)SARS-COV-2 Vaccination 10/03/2019, 10/24/2019   Pneumococcal Conjugate-13 05/28/2017   Pneumococcal Polysaccharide-23 06/05/2018   Tdap 05/13/2019   Zoster Recombinant(Shingrix) 05/13/2019, 08/05/2019    Health Maintenance  Topic Date Due   COVID-19 Vaccine (3 - 2024-25 season) 10/04/2023 (Originally 04/29/2023)   MAMMOGRAM  01/07/2025   Fecal DNA (Cologuard)  09/13/2025   DTaP/Tdap/Td (2 - Td or Tdap) 05/12/2029   Pneumonia Vaccine 39+ Years old  Completed   INFLUENZA VACCINE   Completed   DEXA SCAN  Completed   Hepatitis C Screening  Completed   Zoster Vaccines- Shingrix  Completed   HPV VACCINES  Aged Out    Patient Care Team: Ronnald Nian, MD as PCP - General (Family Medicine) Tanda Rockers, NP as Nurse Practitioner (Obstetrics and Gynecology)  Dentist-Dr. Lajuana Ripple Optho-Dr. Dione Booze Ortho-Dr. August Saucer Derm-Dr. Karlyn Agee  Outpatient Medications Prior to Visit  Medication Sig Note   Biotin 1000 MCG tablet Take 1,000 mcg by mouth daily.    Calcium-Magnesium-Vitamin D (CALCIUM 1200+D3 PO) Take 1 tablet by mouth daily.    Estradiol (VAGIFEM) 10 MCG TABS vaginal tablet Place 1 tablet (10 mcg total) vaginally 2 (two) times a week.    meloxicam (MOBIC) 15 MG tablet TAKE 1 TABLET (15 MG TOTAL) BY MOUTH DAILY. 09/18/2023: Takes 1/2 tablet daily   Multiple Vitamin (MULTIVITAMIN WITH MINERALS) TABS tablet Take 1 tablet by mouth daily.    Omega-3 Fatty Acids (FISH OIL PO) Take by mouth.    vitamin C (ASCORBIC ACID) 500 MG tablet Take 500 mg by mouth daily.    VITAMIN D PO Take by mouth.    diphenhydrAMINE HCl (BENADRYL PO) Take by mouth. (Patient not taking: Reported on 09/18/2023) 09/18/2023: As needed during allergy season.   traZODone (DESYREL) 50 MG tablet TAKE 1/2  TO 1 TABLET BY MOUTH AT BEDTIME AS NEEDED FOR SLEEP (Patient not taking: Reported on 09/18/2023) 09/18/2023: As needed   No facility-administered medications prior to visit.    Review of Systems  All other systems reviewed and are negative.   Family and social history as well as health maintenance and immunizations was reviewed.     Objective:    BP 118/68   Pulse 80   Ht 5\' 3"  (1.6 m)   Wt 180 lb 6.4 oz (81.8 kg)   SpO2 98%   BMI 31.96 kg/m    Physical Exam   No results found for any visits on 09/18/23.     Assessment & Plan:    Discussed health benefits of physical activity, and encouraged her to engage in regular exercise appropriate for her age and condition.  Routine general  medical examination at a health care facility - Plan: CBC with Differential/Platelet, Comprehensive metabolic panel, Lipid panel  Arthritis  Status post total knee replacement, unspecified laterality  Screening for lipid disorders - Plan: Lipid panel   She seems to be doing a good job of diet and exercise but did mention the Mediterranean diet is 1 to use as a template.  Also recommend she check with her insurance to see if one of the new weight loss injectable medications is covered.  Encouraged her to stay physically active which she seems to be doing.  Also discussed RSV.   Sharlot Gowda, MD

## 2023-09-19 ENCOUNTER — Encounter: Payer: Self-pay | Admitting: Family Medicine

## 2023-09-19 LAB — COMPREHENSIVE METABOLIC PANEL
ALT: 21 IU/L (ref 0–32)
AST: 19 IU/L (ref 0–40)
Albumin: 4.8 g/dL (ref 3.8–4.8)
Alkaline Phosphatase: 52 IU/L (ref 44–121)
BUN/Creatinine Ratio: 21 (ref 12–28)
BUN: 15 mg/dL (ref 8–27)
Bilirubin Total: 0.4 mg/dL (ref 0.0–1.2)
CO2: 22 mmol/L (ref 20–29)
Calcium: 9.5 mg/dL (ref 8.7–10.3)
Chloride: 102 mmol/L (ref 96–106)
Creatinine, Ser: 0.7 mg/dL (ref 0.57–1.00)
Globulin, Total: 2.3 g/dL (ref 1.5–4.5)
Glucose: 97 mg/dL (ref 70–99)
Potassium: 4.7 mmol/L (ref 3.5–5.2)
Sodium: 141 mmol/L (ref 134–144)
Total Protein: 7.1 g/dL (ref 6.0–8.5)
eGFR: 91 mL/min/{1.73_m2} (ref >59)

## 2023-09-19 LAB — CBC WITH DIFFERENTIAL/PLATELET
Basophils Absolute: 0.1 10*3/uL (ref 0.0–0.2)
Basos: 1 %
EOS (ABSOLUTE): 0.2 10*3/uL (ref 0.0–0.4)
Eos: 3 %
Hematocrit: 38.9 % (ref 34.0–46.6)
Hemoglobin: 13.2 g/dL (ref 11.1–15.9)
Immature Grans (Abs): 0 10*3/uL (ref 0.0–0.1)
Immature Granulocytes: 0 %
Lymphocytes Absolute: 1.8 10*3/uL (ref 0.7–3.1)
Lymphs: 30 %
MCH: 31.5 pg (ref 26.6–33.0)
MCHC: 33.9 g/dL (ref 31.5–35.7)
MCV: 93 fL (ref 79–97)
Monocytes Absolute: 0.5 10*3/uL (ref 0.1–0.9)
Monocytes: 9 %
Neutrophils Absolute: 3.4 10*3/uL (ref 1.4–7.0)
Neutrophils: 57 %
Platelets: 259 10*3/uL (ref 150–450)
RBC: 4.19 x10E6/uL (ref 3.77–5.28)
RDW: 12.1 % (ref 11.7–15.4)
WBC: 6.1 10*3/uL (ref 3.4–10.8)

## 2023-09-19 LAB — LIPID PANEL
Cholesterol, Total: 251 mg/dL — ABNORMAL HIGH (ref 100–199)
HDL: 78 mg/dL (ref >39)
LDL CALC COMMENT:: 3.2 ratio (ref 0.0–4.4)
LDL Chol Calc (NIH): 157 mg/dL — ABNORMAL HIGH (ref 0–99)
Triglycerides: 93 mg/dL (ref 0–149)
VLDL Cholesterol Cal: 16 mg/dL (ref 5–40)

## 2023-10-23 ENCOUNTER — Encounter: Payer: Self-pay | Admitting: Internal Medicine

## 2023-12-08 ENCOUNTER — Other Ambulatory Visit: Payer: Self-pay | Admitting: Family Medicine

## 2023-12-08 DIAGNOSIS — M1990 Unspecified osteoarthritis, unspecified site: Secondary | ICD-10-CM

## 2023-12-10 NOTE — Telephone Encounter (Signed)
 Is this okay to refill?

## 2024-02-13 ENCOUNTER — Other Ambulatory Visit: Payer: Self-pay | Admitting: Family Medicine

## 2024-02-13 DIAGNOSIS — Z1231 Encounter for screening mammogram for malignant neoplasm of breast: Secondary | ICD-10-CM

## 2024-03-08 ENCOUNTER — Other Ambulatory Visit: Payer: Self-pay | Admitting: Family Medicine

## 2024-03-08 DIAGNOSIS — M1990 Unspecified osteoarthritis, unspecified site: Secondary | ICD-10-CM

## 2024-03-10 NOTE — Telephone Encounter (Signed)
 Last apt 09/12/23

## 2024-03-24 ENCOUNTER — Ambulatory Visit: Admitting: Orthopedic Surgery

## 2024-03-24 ENCOUNTER — Encounter: Payer: Self-pay | Admitting: Orthopedic Surgery

## 2024-03-24 ENCOUNTER — Other Ambulatory Visit: Payer: Self-pay

## 2024-03-24 DIAGNOSIS — M25522 Pain in left elbow: Secondary | ICD-10-CM

## 2024-03-24 MED ORDER — GABAPENTIN 100 MG PO CAPS: 100.0000 mg | ORAL_CAPSULE | Freq: Two times a day (BID) | ORAL | 0 refills | Status: DC

## 2024-03-24 NOTE — Progress Notes (Unsigned)
 Office Visit Note   Patient: Jennifer Whitehead           Date of Birth: 05/09/1951           MRN: [id number] Visit Date: 03/24/2024 Requested by: Joyce Norleen BROCKS, MD [address] [place],  [place] [address] PCP: Joyce Norleen BROCKS, MD  Subjective: Chief Complaint  Patient presents with   Left Elbow - Pain    HPI: Jennifer Whitehead is a 73 y.o. female who presents to the office reporting left arm pain for several weeks.  Reports primarily medial sided pain with some burning component.  Certain motions are painful in the forearm.  She has had prior forearm fracture with radial head excision about 30 years ago.  She is right-hand dominant.  Pain does not wake her from sleep at night.  Denies any numbness and tingling.  Mobic  and Tylenol  have not been helpful.  Has a catching pain occasionally.  No numbness and tingling in the hand.  She is very active and works out doing biceps triceps as well as other upper extremity resistance exercises.                ROS: All systems reviewed are negative as they relate to the chief complaint within the history of present illness.  Patient denies fevers or chills.  Assessment & Plan: Visit Diagnoses:  1. Pain in left elbow     Plan: Impression his left arm pain with history of radial head excision.  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  Lacks about 10 degrees of full extension but otherwise has full flexion and only a mild amount of elbow laxity compared to the right-hand side.  No operative indication at this time.  I think she could try gabapentin  to see if that helps some with the nerve pain.  In general she is very active and we talked about a potential ultrasound-guided injection into the ulnohumeral joint today but she wants to hold off on that.  That would be the next step in intervention should her symptoms persist or worsen.  Follow-Up Instructions: No follow-ups on file.   Orders:  Orders Placed This Encounter  Procedures    XR Elbow 2 Views Left   Meds ordered this encounter  Medications   gabapentin  (NEURONTIN ) 100 MG capsule    Sig: Take 1 capsule (100 mg total) by mouth 2 (two) times daily.    Dispense:  60 capsule    Refill:  0      Procedures: No procedures performed   Clinical Data: No additional findings.  Objective: Vital Signs: There were no vitals taken for this visit.  Physical Exam:  Constitutional: Patient appears well-developed HEENT:  Head: Normocephalic Eyes:EOM are normal Neck: Normal range of motion Cardiovascular: Normal rate Pulmonary/chest: Effort normal Neurologic: Patient is alert Skin: Skin is warm Psychiatric: Patient has normal mood and affect  Ortho Exam: Ortho exam demonstrates full active and passive range of motion of the right arm.  On the left-hand side she lacks about 10 degrees of full extension but has full flexion.  A little bit of increased laxity to varus and valgus stress in full extension on the left compared to the right but no instability.  Biceps and triceps are palpable and functional.  Motor or sensory function of the hands intact.  No ulnar paresthesias in particular.  Cervical spine range of motion full  Specialty Comments:  No specialty comments available.  Imaging: No results found.  PMFS History: Patient Active Problem List   Diagnosis Date Noted   Arthritis 08/16/2021   S/P TKR (total knee replacement)    At high risk for breast cancer 05/09/2016   Past Medical History:  Diagnosis Date   Anxiety    Arthritis    in both knees   Broken arm    left   Deep vein blood clot of left lower extremity (HCC)    left leg    Family History  Problem Relation Age of Onset   Alcohol abuse Father     Past Surgical History:  Procedure Laterality Date   ANTERIOR CRUCIATE LIGAMENT REPAIR Left    BUNIONECTOMY Bilateral    dental bone grafts     HARDWARE REMOVAL Left 09/24/2018   Procedure: Hardware Removal Left Knee;  Surgeon: Addie Cordella Hamilton, MD;  Location: [facility] OR;  Service: Orthopedics;  Laterality: Left;   RADIAL HEAD EXCISION Left    TOTAL KNEE ARTHROPLASTY Left 09/24/2018   Procedure: LEFT TOTAL KNEE ARTHROPLASTY;  Surgeon: Addie Cordella Hamilton, MD;  Location: [facility] OR;  Service: Orthopedics;  Laterality: Left;   Social History   Occupational History   Not on file  Tobacco Use   Smoking status: Former    Current packs/day: 0.00    Average packs/day: 0.3 packs/day for 4.0 years (1.0 ttl pk-yrs)    Types: Cigarettes    Start date: 09/16/1972    Quit date: 09/16/1976    Years since quitting: 47.5   Smokeless tobacco: Never  Substance and Sexual Activity   Alcohol use: Yes    Comment: once a week   Drug use: Never   Sexual activity: Yes    Partners: Male    Birth control/protection: Post-menopausal    Comment: menarche 73yo, sexual debut 73yo,

## 2024-03-31 ENCOUNTER — Ambulatory Visit
Admission: RE | Admit: 2024-03-31 | Discharge: 2024-03-31 | Disposition: A | Discharge status: Home / Self Care | Source: Ambulatory Visit | Attending: Family Medicine | Admitting: Family Medicine

## 2024-03-31 ENCOUNTER — Ambulatory Visit

## 2024-03-31 ENCOUNTER — Encounter: Payer: Self-pay | Admitting: Radiology

## 2024-03-31 ENCOUNTER — Ambulatory Visit (INDEPENDENT_AMBULATORY_CARE_PROVIDER_SITE_OTHER): Admitting: Radiology

## 2024-03-31 VITALS — BP 118/70 | HR 84 | Ht 64.0 in | Wt 174.4 lb

## 2024-03-31 DIAGNOSIS — N952 Postmenopausal atrophic vaginitis: Secondary | ICD-10-CM

## 2024-03-31 DIAGNOSIS — Z01419 Encounter for gynecological examination (general) (routine) without abnormal findings: Secondary | ICD-10-CM

## 2024-03-31 DIAGNOSIS — Z1331 Encounter for screening for depression: Secondary | ICD-10-CM | POA: Diagnosis not present

## 2024-03-31 DIAGNOSIS — Z1231 Encounter for screening mammogram for malignant neoplasm of breast: Secondary | ICD-10-CM

## 2024-03-31 MED ORDER — ESTRADIOL 10 MCG VA TABS: 1.0000 | ORAL_TABLET | VAGINAL | 4 refills | Status: DC

## 2024-03-31 NOTE — Progress Notes (Signed)
   Jennifer Whitehead [birth year] [id number]   History: Postmenopausal 73 y.o. presents for annual exam. No gyn concerns.    Gynecologic History Postmenopausal Last Pap: 2024. Results were: normal Last mammogram: 5/24. Results were: normal Last colonoscopy: 1/24 DEXA:6/23 normal repeat due 2028   Obstetric History OB History  Gravida Para Term Preterm AB Living  0 0 0 0 0 0  SAB IAB Ectopic Multiple Live Births  0 0 0 0 0      03/31/2024   12:20 PM 09/18/2023    9:16 AM 08/25/2022    1:36 PM  Depression screen PHQ 2/9  Decreased Interest 0 0 0  Down, Depressed, Hopeless 0 0 0  PHQ - 2 Score 0 0 0     The following portions of the patient's history were reviewed and updated as appropriate: allergies, current medications, past family history, past medical history, past social history, past surgical history, and problem list.  Review of Systems Pertinent items noted in HPI and remainder of comprehensive ROS otherwise negative.  Past medical history, past surgical history, family history and social history were all reviewed and documented in the EPIC chart.  Exam:  There were no vitals filed for this visit.  There is no height or weight on file to calculate BMI.  General appearance:  Normal Thyroid :  Symmetrical, normal in size, without palpable masses or nodularity. Respiratory  Auscultation:  Clear without wheezing or rhonchi Cardiovascular  Auscultation:  Regular rate, without rubs, murmurs or gallops  Edema/varicosities:  Not grossly evident Abdominal  Soft,nontender, without masses, guarding or rebound.  Liver/spleen:  No organomegaly noted  Hernia:  None appreciated  Skin  Inspection:  Grossly normal Breasts: Examined lying and sitting.   Right: Without masses, retractions, nipple discharge or axillary adenopathy.   Left: Without masses, retractions, nipple discharge or axillary adenopathy. Genitourinary   Inguinal/mons:  Normal without inguinal  adenopathy  External genitalia:  Normal appearing vulva with no masses, tenderness, or lesions  BUS/Urethra/Skene's glands:  Normal  Vagina:  Normal appearing with normal color and discharge, no lesions. Atrophy: moderate   Cervix:  Normal appearing without discharge or lesions  Uterus:  Normal in size, shape and contour.  Midline and mobile, nontender  Adnexa/parametria:     Rt: Normal in size, without masses or tenderness.   Lt: Normal in size, without masses or tenderness.  Anus and perineum: Normal    Heinz Senters, CMA present for exam  Assessment/Plan:   1. Well woman exam with routine gynecological exam (Primary) Pap 2027 at patient request Mammo yearly  2. Postmenopausal atrophic vaginitis Continue estradiol  vaginal tablets  3. Depression screen    Return in 1 year for annual or sooner prn.  Tully Mcinturff B WHNP-BC, 11:37 AM 03/31/2024

## 2024-04-02 ENCOUNTER — Encounter: Payer: No Typology Code available for payment source | Admitting: Radiology

## 2024-04-23 ENCOUNTER — Other Ambulatory Visit: Payer: Self-pay | Admitting: Orthopedic Surgery

## 2024-05-21 ENCOUNTER — Other Ambulatory Visit: Payer: Self-pay | Admitting: Orthopedic Surgery

## 2024-06-07 ENCOUNTER — Other Ambulatory Visit: Payer: Self-pay | Admitting: Family Medicine

## 2024-06-07 DIAGNOSIS — M1990 Unspecified osteoarthritis, unspecified site: Secondary | ICD-10-CM

## 2024-06-14 ENCOUNTER — Other Ambulatory Visit: Payer: Self-pay | Admitting: Family Medicine

## 2024-06-14 DIAGNOSIS — M1990 Unspecified osteoarthritis, unspecified site: Secondary | ICD-10-CM

## 2024-06-19 ENCOUNTER — Other Ambulatory Visit: Payer: Self-pay | Admitting: Surgical

## 2024-06-30 ENCOUNTER — Encounter: Payer: Self-pay | Admitting: Radiology

## 2024-07-25 ENCOUNTER — Other Ambulatory Visit: Payer: Self-pay | Admitting: Surgical

## 2024-09-08 ENCOUNTER — Telehealth: Payer: Self-pay

## 2024-09-08 NOTE — Telephone Encounter (Signed)
 Pt left a voicemail stating she just had an insurance change and would like to know if NP could please change her medication?  From:    Vagifem     To:   Yuvafem   FYI:  Estradiol  (VAGIFEM ) 10 MCG TABS vaginal tablet  Start:  03/31/24 Dispense:  24 tablets Refills:4

## 2024-09-08 NOTE — Telephone Encounter (Signed)
 Ok to send

## 2024-09-10 ENCOUNTER — Other Ambulatory Visit: Payer: Self-pay

## 2024-09-10 MED ORDER — ESTRADIOL 10 MCG VA TABS: 1.0000 | ORAL_TABLET | VAGINAL | 2 refills | Status: AC

## 2024-09-10 NOTE — Telephone Encounter (Signed)
 Med refill request:   Estradiol  (VAGIFEM ) 10 MCG TABS vaginal tablet  Start:  03/31/24 Disp: 24 tablets Refills:  4  *Pt is requesting a prescription change to:  YUVAFEM  Okayed by NP.  Sent 24 tablets with 2 refills.  Last AEX:  03/31/24 Next AEX:  04/02/25 Last MMG (if hormonal med):  03/31/24 Refill authorized? Please Advise.

## 2024-10-22 ENCOUNTER — Ambulatory Visit: Payer: Medicare Other | Admitting: Family Medicine

## 2024-11-13 ENCOUNTER — Ambulatory Visit: Payer: Self-pay | Admitting: Family Medicine

## 2025-04-02 ENCOUNTER — Ambulatory Visit: Admitting: Radiology
